# Patient Record
Sex: Female | Born: 1980 | Race: White | Hispanic: No | Marital: Married | Smoking: Never smoker
Health system: Southern US, Community
[De-identification: ages and names within clinical notes are randomized; demographics above are authoritative.]

## PROBLEM LIST (undated history)

## (undated) DIAGNOSIS — R87629 Unspecified abnormal cytological findings in specimens from vagina: Secondary | ICD-10-CM

## (undated) DIAGNOSIS — Z8619 Personal history of other infectious and parasitic diseases: Secondary | ICD-10-CM

## (undated) DIAGNOSIS — K439 Ventral hernia without obstruction or gangrene: Secondary | ICD-10-CM

## (undated) DIAGNOSIS — Z87820 Personal history of traumatic brain injury: Secondary | ICD-10-CM

## (undated) DIAGNOSIS — Z87828 Personal history of other (healed) physical injury and trauma: Secondary | ICD-10-CM

## (undated) HISTORY — DX: Unspecified abnormal cytological findings in specimens from vagina: R87.629

## (undated) HISTORY — DX: Personal history of other infectious and parasitic diseases: Z86.19

## (undated) HISTORY — DX: Personal history of traumatic brain injury: Z87.820

## (undated) HISTORY — PX: FOOT SURGERY: SHX648

## (undated) HISTORY — PX: LEEP: SHX91

## (undated) HISTORY — DX: Personal history of other (healed) physical injury and trauma: Z87.828

## (undated) HISTORY — PX: OTHER SURGICAL HISTORY: SHX169

---

## 2004-04-07 ENCOUNTER — Emergency Department (HOSPITAL_COMMUNITY): Admission: EM | Admit: 2004-04-07 | Discharge: 2004-04-07 | Payer: Self-pay | Admitting: *Deleted

## 2004-04-23 ENCOUNTER — Encounter: Admission: RE | Admit: 2004-04-23 | Discharge: 2004-04-23 | Payer: Self-pay | Admitting: Obstetrics and Gynecology

## 2004-08-04 ENCOUNTER — Emergency Department (HOSPITAL_COMMUNITY): Admission: EM | Admit: 2004-08-04 | Discharge: 2004-08-04 | Payer: Self-pay | Admitting: *Deleted

## 2004-08-10 ENCOUNTER — Emergency Department (HOSPITAL_COMMUNITY): Admission: EM | Admit: 2004-08-10 | Discharge: 2004-08-10 | Payer: Self-pay | Admitting: Emergency Medicine

## 2005-09-24 ENCOUNTER — Other Ambulatory Visit: Admission: RE | Admit: 2005-09-24 | Discharge: 2005-09-24 | Payer: Self-pay | Admitting: Obstetrics and Gynecology

## 2015-05-07 LAB — OB RESULTS CONSOLE ANTIBODY SCREEN: ANTIBODY SCREEN: NEGATIVE

## 2015-05-07 LAB — OB RESULTS CONSOLE ABO/RH: RH Type: POSITIVE

## 2015-05-07 LAB — OB RESULTS CONSOLE GC/CHLAMYDIA
CHLAMYDIA, DNA PROBE: NEGATIVE
GC PROBE AMP, GENITAL: NEGATIVE

## 2015-05-07 LAB — OB RESULTS CONSOLE HEPATITIS B SURFACE ANTIGEN: HEP B S AG: NEGATIVE

## 2015-05-07 LAB — OB RESULTS CONSOLE RUBELLA ANTIBODY, IGM: RUBELLA: IMMUNE

## 2015-05-07 LAB — OB RESULTS CONSOLE HIV ANTIBODY (ROUTINE TESTING): HIV: NONREACTIVE

## 2015-05-07 LAB — OB RESULTS CONSOLE RPR: RPR: NONREACTIVE

## 2015-12-12 ENCOUNTER — Encounter (HOSPITAL_COMMUNITY): Payer: Self-pay | Admitting: *Deleted

## 2015-12-12 ENCOUNTER — Telehealth (HOSPITAL_COMMUNITY): Payer: Self-pay | Admitting: *Deleted

## 2015-12-12 LAB — OB RESULTS CONSOLE GBS: GBS: POSITIVE

## 2015-12-12 NOTE — Telephone Encounter (Signed)
Preadmission screen  

## 2015-12-13 ENCOUNTER — Inpatient Hospital Stay (HOSPITAL_COMMUNITY): Admission: AD | Admit: 2015-12-13 | Payer: Self-pay | Source: Ambulatory Visit | Admitting: Obstetrics and Gynecology

## 2015-12-17 ENCOUNTER — Inpatient Hospital Stay (HOSPITAL_COMMUNITY)
Admission: RE | Admit: 2015-12-17 | Discharge: 2015-12-20 | DRG: 766 | Disposition: A | Discharge status: Home / Self Care | Payer: PRIVATE HEALTH INSURANCE | Source: Ambulatory Visit | Attending: Obstetrics and Gynecology | Admitting: Obstetrics and Gynecology

## 2015-12-17 ENCOUNTER — Inpatient Hospital Stay (HOSPITAL_COMMUNITY): Payer: PRIVATE HEALTH INSURANCE | Admitting: Anesthesiology

## 2015-12-17 ENCOUNTER — Encounter (HOSPITAL_COMMUNITY)
Admission: RE | Disposition: A | Discharge status: Home / Self Care | Payer: Self-pay | Source: Ambulatory Visit | Attending: Obstetrics and Gynecology

## 2015-12-17 ENCOUNTER — Encounter (HOSPITAL_COMMUNITY): Payer: Self-pay

## 2015-12-17 VITALS — BP 115/66 | HR 62 | Temp 97.6°F | Resp 20 | Ht 67.0 in | Wt 193.0 lb

## 2015-12-17 DIAGNOSIS — Z3A40 40 weeks gestation of pregnancy: Secondary | ICD-10-CM

## 2015-12-17 DIAGNOSIS — O99824 Streptococcus B carrier state complicating childbirth: Secondary | ICD-10-CM | POA: Diagnosis present

## 2015-12-17 DIAGNOSIS — O480 Post-term pregnancy: Secondary | ICD-10-CM | POA: Diagnosis present

## 2015-12-17 DIAGNOSIS — Z98891 History of uterine scar from previous surgery: Secondary | ICD-10-CM

## 2015-12-17 LAB — CBC
HEMATOCRIT: 36.3 % (ref 36.0–46.0)
Hemoglobin: 12.9 g/dL (ref 12.0–15.0)
MCH: 32.5 pg (ref 26.0–34.0)
MCHC: 35.5 g/dL (ref 30.0–36.0)
MCV: 91.4 fL (ref 78.0–100.0)
PLATELETS: 222 10*3/uL (ref 150–400)
RBC: 3.97 MIL/uL (ref 3.87–5.11)
RDW: 13.2 % (ref 11.5–15.5)
WBC: 10.8 10*3/uL — AB (ref 4.0–10.5)

## 2015-12-17 LAB — ABO/RH: ABO/RH(D): O POS

## 2015-12-17 LAB — TYPE AND SCREEN
ABO/RH(D): O POS
ANTIBODY SCREEN: NEGATIVE

## 2015-12-17 SURGERY — Surgical Case
Anesthesia: General

## 2015-12-17 MED ORDER — SODIUM BICARBONATE 8.4 % IV SOLN
INTRAVENOUS | Status: AC
  Filled 2015-12-17: qty 50

## 2015-12-17 MED ORDER — LIDOCAINE-EPINEPHRINE (PF) 2 %-1:200000 IJ SOLN
INTRAMUSCULAR | Status: AC
  Filled 2015-12-17: qty 20

## 2015-12-17 MED ORDER — OXYTOCIN 10 UNIT/ML IJ SOLN
1.0000 m[IU]/min | INTRAVENOUS | Status: DC
  Administered 2015-12-17: 2 m[IU]/min via INTRAVENOUS
  Filled 2015-12-17: qty 4

## 2015-12-17 MED ORDER — LIDOCAINE HCL (PF) 1 % IJ SOLN: 30.0000 mL | INTRAMUSCULAR | Status: DC | PRN

## 2015-12-17 MED ORDER — ONDANSETRON HCL 4 MG/2ML IJ SOLN
INTRAMUSCULAR | Status: DC | PRN
  Administered 2015-12-17: 4 mg via INTRAVENOUS

## 2015-12-17 MED ORDER — DEXAMETHASONE SODIUM PHOSPHATE 10 MG/ML IJ SOLN
INTRAMUSCULAR | Status: AC
  Filled 2015-12-17: qty 1

## 2015-12-17 MED ORDER — SCOPOLAMINE 1 MG/3DAYS TD PT72
MEDICATED_PATCH | TRANSDERMAL | Status: DC | PRN
  Administered 2015-12-17: 1 via TRANSDERMAL

## 2015-12-17 MED ORDER — TERBUTALINE SULFATE 1 MG/ML IJ SOLN: 0.2500 mg | Freq: Once | INTRAMUSCULAR | Status: DC | PRN

## 2015-12-17 MED ORDER — CEFAZOLIN SODIUM 1-5 GM-% IV SOLN: INTRAVENOUS | Status: DC | PRN

## 2015-12-17 MED ORDER — LACTATED RINGERS IV SOLN
500.0000 mL | Freq: Once | INTRAVENOUS | Status: AC
  Administered 2015-12-17: 500 mL via INTRAVENOUS

## 2015-12-17 MED ORDER — OXYTOCIN BOLUS FROM INFUSION: 500.0000 mL | INTRAVENOUS | Status: DC

## 2015-12-17 MED ORDER — MORPHINE SULFATE (PF) 0.5 MG/ML IJ SOLN
INTRAMUSCULAR | Status: DC | PRN
  Administered 2015-12-17: 1 mg via INTRAVENOUS
  Administered 2015-12-17: 4 mg via EPIDURAL

## 2015-12-17 MED ORDER — DEXAMETHASONE SODIUM PHOSPHATE 10 MG/ML IJ SOLN
INTRAMUSCULAR | Status: DC | PRN
  Administered 2015-12-17: 10 mg via INTRAVENOUS

## 2015-12-17 MED ORDER — CITRIC ACID-SODIUM CITRATE 334-500 MG/5ML PO SOLN
30.0000 mL | ORAL | Status: DC | PRN
  Administered 2015-12-17: 30 mL via ORAL
  Filled 2015-12-17: qty 15

## 2015-12-17 MED ORDER — SODIUM BICARBONATE 8.4 % IV SOLN
INTRAVENOUS | Status: DC | PRN
  Administered 2015-12-17 (×3): 5 mL via EPIDURAL

## 2015-12-17 MED ORDER — PENICILLIN G POTASSIUM 5000000 UNITS IJ SOLR
5.0000 10*6.[IU] | Freq: Once | INTRAVENOUS | Status: AC
  Administered 2015-12-17: 5 10*6.[IU] via INTRAVENOUS
  Filled 2015-12-17: qty 5

## 2015-12-17 MED ORDER — CEFAZOLIN SODIUM-DEXTROSE 2-3 GM-% IV SOLR
INTRAVENOUS | Status: DC | PRN
  Administered 2015-12-17: 2 g via INTRAVENOUS

## 2015-12-17 MED ORDER — LACTATED RINGERS IV SOLN
500.0000 mL | INTRAVENOUS | Status: DC | PRN
  Administered 2015-12-17: 500 mL via INTRAVENOUS

## 2015-12-17 MED ORDER — LIDOCAINE HCL (PF) 1 % IJ SOLN
INTRAMUSCULAR | Status: DC | PRN
  Administered 2015-12-17 (×2): 4 mL

## 2015-12-17 MED ORDER — CEFAZOLIN SODIUM-DEXTROSE 2-4 GM/100ML-% IV SOLN
INTRAVENOUS | Status: AC
  Filled 2015-12-17: qty 100

## 2015-12-17 MED ORDER — PENICILLIN G POTASSIUM 5000000 UNITS IJ SOLR
2.5000 10*6.[IU] | INTRAVENOUS | Status: DC
  Administered 2015-12-17 (×2): 2.5 10*6.[IU] via INTRAVENOUS
  Filled 2015-12-17 (×6): qty 2.5

## 2015-12-17 MED ORDER — SCOPOLAMINE 1 MG/3DAYS TD PT72
MEDICATED_PATCH | TRANSDERMAL | Status: AC
  Filled 2015-12-17: qty 1

## 2015-12-17 MED ORDER — PHENYLEPHRINE 40 MCG/ML (10ML) SYRINGE FOR IV PUSH (FOR BLOOD PRESSURE SUPPORT)
80.0000 ug | PREFILLED_SYRINGE | INTRAVENOUS | Status: DC | PRN
  Administered 2015-12-17: 80 ug via INTRAVENOUS
  Filled 2015-12-17: qty 20

## 2015-12-17 MED ORDER — OXYTOCIN 10 UNIT/ML IJ SOLN
INTRAMUSCULAR | Status: AC
  Filled 2015-12-17: qty 4

## 2015-12-17 MED ORDER — PHENYLEPHRINE 40 MCG/ML (10ML) SYRINGE FOR IV PUSH (FOR BLOOD PRESSURE SUPPORT): 80.0000 ug | PREFILLED_SYRINGE | INTRAVENOUS | Status: DC | PRN

## 2015-12-17 MED ORDER — OXYCODONE-ACETAMINOPHEN 5-325 MG PO TABS: 1.0000 | ORAL_TABLET | ORAL | Status: DC | PRN

## 2015-12-17 MED ORDER — MORPHINE SULFATE (PF) 0.5 MG/ML IJ SOLN
INTRAMUSCULAR | Status: AC
  Filled 2015-12-17: qty 10

## 2015-12-17 MED ORDER — ONDANSETRON HCL 4 MG/2ML IJ SOLN
4.0000 mg | Freq: Four times a day (QID) | INTRAMUSCULAR | Status: DC | PRN
  Administered 2015-12-17: 4 mg via INTRAVENOUS
  Filled 2015-12-17: qty 2

## 2015-12-17 MED ORDER — LACTATED RINGERS IV SOLN
INTRAVENOUS | Status: DC | PRN
  Administered 2015-12-17 (×2): via INTRAVENOUS

## 2015-12-17 MED ORDER — ONDANSETRON HCL 4 MG/2ML IJ SOLN
INTRAMUSCULAR | Status: AC
  Filled 2015-12-17: qty 2

## 2015-12-17 MED ORDER — PHENYLEPHRINE 40 MCG/ML (10ML) SYRINGE FOR IV PUSH (FOR BLOOD PRESSURE SUPPORT)
PREFILLED_SYRINGE | INTRAVENOUS | Status: AC
  Filled 2015-12-17: qty 10

## 2015-12-17 MED ORDER — FLEET ENEMA 7-19 GM/118ML RE ENEM: 1.0000 | ENEMA | RECTAL | Status: DC | PRN

## 2015-12-17 MED ORDER — LACTATED RINGERS IV SOLN
INTRAVENOUS | Status: DC | PRN
  Administered 2015-12-17: 23:00:00 via INTRAVENOUS

## 2015-12-17 MED ORDER — PHENYLEPHRINE HCL 10 MG/ML IJ SOLN
INTRAMUSCULAR | Status: DC | PRN
  Administered 2015-12-17 (×2): 80 ug via INTRAVENOUS
  Administered 2015-12-17: 120 ug via INTRAVENOUS

## 2015-12-17 MED ORDER — OXYCODONE-ACETAMINOPHEN 5-325 MG PO TABS: 2.0000 | ORAL_TABLET | ORAL | Status: DC | PRN

## 2015-12-17 MED ORDER — OXYTOCIN 10 UNIT/ML IJ SOLN: 2.5000 [IU]/h | INTRAVENOUS | Status: DC

## 2015-12-17 MED ORDER — EPHEDRINE 5 MG/ML INJ: 10.0000 mg | INTRAVENOUS | Status: DC | PRN

## 2015-12-17 MED ORDER — ACETAMINOPHEN 325 MG PO TABS: 650.0000 mg | ORAL_TABLET | ORAL | Status: DC | PRN

## 2015-12-17 MED ORDER — MISOPROSTOL 25 MCG QUARTER TABLET
25.0000 ug | ORAL_TABLET | ORAL | Status: DC | PRN
  Administered 2015-12-17: 25 ug via VAGINAL
  Filled 2015-12-17: qty 0.25

## 2015-12-17 MED ORDER — LACTATED RINGERS IV SOLN
INTRAVENOUS | Status: DC
  Administered 2015-12-17 (×3): via INTRAVENOUS

## 2015-12-17 MED ORDER — METOCLOPRAMIDE HCL 5 MG/ML IJ SOLN
INTRAMUSCULAR | Status: AC
  Filled 2015-12-17: qty 2

## 2015-12-17 MED ORDER — DIPHENHYDRAMINE HCL 50 MG/ML IJ SOLN: 12.5000 mg | INTRAMUSCULAR | Status: DC | PRN

## 2015-12-17 MED ORDER — SODIUM CHLORIDE 0.9 % IR SOLN
Status: DC | PRN
  Administered 2015-12-17: 1000 mL

## 2015-12-17 MED ORDER — METOCLOPRAMIDE HCL 5 MG/ML IJ SOLN
INTRAMUSCULAR | Status: DC | PRN
  Administered 2015-12-17: 10 mg via INTRAVENOUS

## 2015-12-17 MED ORDER — FENTANYL 2.5 MCG/ML BUPIVACAINE 1/10 % EPIDURAL INFUSION (WH - ANES)
14.0000 mL/h | INTRAMUSCULAR | Status: DC | PRN
  Administered 2015-12-17: 14 mL/h via EPIDURAL
  Filled 2015-12-17: qty 125

## 2015-12-17 MED ORDER — OXYTOCIN 10 UNIT/ML IJ SOLN
40.0000 [IU] | INTRAVENOUS | Status: DC | PRN
  Administered 2015-12-17: 40 [IU] via INTRAVENOUS

## 2015-12-17 SURGICAL SUPPLY — 34 items
BENZOIN TINCTURE PRP APPL 2/3 (GAUZE/BANDAGES/DRESSINGS) ×3 IMPLANT
CHLORAPREP W/TINT 26ML (MISCELLANEOUS) ×3 IMPLANT
CLAMP CORD UMBIL (MISCELLANEOUS) IMPLANT
CLOSURE STERI STRIP 1/2 X4 (GAUZE/BANDAGES/DRESSINGS) ×3 IMPLANT
CLOSURE WOUND 1/2 X4 (GAUZE/BANDAGES/DRESSINGS)
CLOTH BEACON ORANGE TIMEOUT ST (SAFETY) ×3 IMPLANT
CONTAINER PREFILL 10% NBF 15ML (MISCELLANEOUS) IMPLANT
DRSG OPSITE POSTOP 4X10 (GAUZE/BANDAGES/DRESSINGS) ×3 IMPLANT
ELECT REM PT RETURN 9FT ADLT (ELECTROSURGICAL) ×3
ELECTRODE REM PT RTRN 9FT ADLT (ELECTROSURGICAL) ×1 IMPLANT
EXTRACTOR VACUUM M CUP 4 TUBE (SUCTIONS) IMPLANT
EXTRACTOR VACUUM M CUP 4' TUBE (SUCTIONS)
GLOVE BIO SURGEON STRL SZ8 (GLOVE) ×3 IMPLANT
GLOVE BIOGEL PI IND STRL 7.0 (GLOVE) ×2 IMPLANT
GLOVE BIOGEL PI INDICATOR 7.0 (GLOVE) ×4
GOWN STRL REUS W/TWL LRG LVL3 (GOWN DISPOSABLE) ×9 IMPLANT
KIT ABG SYR 3ML LUER SLIP (SYRINGE) ×3 IMPLANT
LIQUID BAND (GAUZE/BANDAGES/DRESSINGS) IMPLANT
NEEDLE HYPO 25X5/8 SAFETYGLIDE (NEEDLE) ×3 IMPLANT
NS IRRIG 1000ML POUR BTL (IV SOLUTION) ×3 IMPLANT
PACK C SECTION WH (CUSTOM PROCEDURE TRAY) ×3 IMPLANT
PAD OB MATERNITY 4.3X12.25 (PERSONAL CARE ITEMS) ×3 IMPLANT
PENCIL SMOKE EVAC W/HOLSTER (ELECTROSURGICAL) ×3 IMPLANT
STRIP CLOSURE SKIN 1/2X4 (GAUZE/BANDAGES/DRESSINGS) IMPLANT
SUT MNCRL 0 VIOLET CTX 36 (SUTURE) ×4 IMPLANT
SUT MONOCRYL 0 CTX 36 (SUTURE) ×8
SUT PDS AB 0 CTX 60 (SUTURE) ×3 IMPLANT
SUT PLAIN 0 NONE (SUTURE) IMPLANT
SUT PLAIN 2 0 (SUTURE) ×2
SUT PLAIN 2 0 XLH (SUTURE) IMPLANT
SUT PLAIN ABS 2-0 CT1 27XMFL (SUTURE) ×1 IMPLANT
SUT VIC AB 4-0 KS 27 (SUTURE) ×3 IMPLANT
TOWEL OR 17X24 6PK STRL BLUE (TOWEL DISPOSABLE) ×3 IMPLANT
TRAY FOLEY CATH SILVER 14FR (SET/KITS/TRAYS/PACK) ×3 IMPLANT

## 2015-12-17 NOTE — Anesthesia Preprocedure Evaluation (Addendum)
Anesthesia Evaluation  Patient identified by MRN, date of birth, ID band Patient awake    Reviewed: Allergy & Precautions, NPO status , Patient's Chart, lab work & pertinent test results  History of Anesthesia Complications Negative for: history of anesthetic complications  Airway Mallampati: II  TM Distance: >3 FB Neck ROM: Full    Dental no notable dental hx. (+) Dental Advisory Given   Pulmonary neg pulmonary ROS,    Pulmonary exam normal breath sounds clear to auscultation       Cardiovascular negative cardio ROS Normal cardiovascular exam Rhythm:Regular Rate:Normal     Neuro/Psych negative neurological ROS  negative psych ROS   GI/Hepatic negative GI ROS, Neg liver ROS,   Endo/Other  negative endocrine ROSobesity  Renal/GU negative Renal ROS  negative genitourinary   Musculoskeletal negative musculoskeletal ROS (+)   Abdominal   Peds negative pediatric ROS (+)  Hematology negative hematology ROS (+)   Anesthesia Other Findings   Reproductive/Obstetrics (+) Pregnancy                            Anesthesia Physical Anesthesia Plan  ASA: II  Anesthesia Plan: General   Post-op Pain Management:    Induction: Intravenous  Airway Management Planned:   Additional Equipment:   Intra-op Plan:   Post-operative Plan: Extubation in OR  Informed Consent: I have reviewed the patients History and Physical, chart, labs and discussed the procedure including the risks, benefits and alternatives for the proposed anesthesia with the patient or authorized representative who has indicated his/her understanding and acceptance.   Dental advisory given  Plan Discussed with: CRNA  Anesthesia Plan Comments:         Anesthesia Quick Evaluation

## 2015-12-17 NOTE — Progress Notes (Signed)
Pitocin on FHT cat one UCs q2-3 min Cx  1/50/-3/vtx

## 2015-12-17 NOTE — H&P (Signed)
Mary Frye is a 35 y.o. female presenting for 2 stage IOL. Admitted last PM. S/P cytotec. No ROM, no bleeding. Maternal Medical History:  Fetal activity: Perceived fetal activity is normal.      OB History    Gravida Para Term Preterm AB TAB SAB Ectopic Multiple Living   2    1  1         Past Medical History  Diagnosis Date  . Vaginal Pap smear, abnormal   . Hx of varicella   . History of back injury     horse injury trt with physical therapy  . History of concussion     years ago horse injury   Past Surgical History  Procedure Laterality Date  . Leep    . Dental implants      35 yo   Family History: family history includes Cancer in her maternal aunt and maternal aunt; Seizures in her maternal aunt. Social History:  reports that she has never smoked. She does not have any smokeless tobacco history on file. Her alcohol and drug histories are not on file.   Prenatal Transfer Tool  Maternal Diabetes: No Genetic Screening: Normal Maternal Ultrasounds/Referrals: Normal Fetal Ultrasounds or other Referrals:  None Maternal Substance Abuse:  No Significant Maternal Medications:  None Significant Maternal Lab Results:  None Other Comments:  None  Review of Systems  Eyes: Negative for blurred vision.  Gastrointestinal: Negative for abdominal pain.  Neurological: Negative for headaches.    Dilation: 1 Effacement (%): 30 Station: -2 Exam by:: Dr Henderson Cloudomblin Blood pressure 123/79, pulse 75, temperature 97.5 F (36.4 C), temperature source Oral, resp. rate 16, height 5\' 7"  (1.702 m), weight 193 lb (87.544 kg), last menstrual period 03/08/2015. Maternal Exam:  Uterine Assessment: Contraction strength is mild.  Contraction frequency is irregular.   Abdomen: Patient reports no abdominal tenderness.   Fetal Exam Fetal State Assessment: Category I - tracings are normal.     Physical Exam  Cardiovascular: Normal rate and regular rhythm.   Respiratory: Effort normal and  breath sounds normal.  GI: Soft. There is no tenderness.  Neurological: She has normal reflexes.    Cx 1/30/high/soft Bedside U/S  =  vtx  Prenatal labs: ABO, Rh: --/--/O POS, O POS (04/24 0055) Antibody: NEG (04/24 0055) Rubella: Immune (09/12 0000) RPR: Nonreactive (09/12 0000)  HBsAg: Negative (09/12 0000)  HIV: Non-reactive (09/12 0000)  GBS: Positive (04/19 0000)   Assessment/Plan: 35 yo G2P0 @ 40 4/7 weeks D/W patient and husband process and risks of two stage induction of labor Begin Pitocin   Mary Frye,Mary Frye 12/17/2015, 8:23 AM

## 2015-12-17 NOTE — Brief Op Note (Signed)
12/17/2015  11:49 PM  PATIENT:  Mary Frye  35 y.o. female  PRE-OPERATIVE DIAGNOSIS:  Arrest of dilation arrest of dialation  POST-OPERATIVE DIAGNOSIS:  Arrest of dilation arrest of dialation  PROCEDURE:  Procedure(s): CESAREAN SECTION (N/A)  SURGEON:  Surgeon(s) and Role:    * Harold HedgeJames Aubree Doody, MD - Primary  PHYSICIAN ASSISTANT:   ASSISTANTS: none   ANESTHESIA:   epidural  EBL:  Total I/O In: 1000 [I.V.:1000] Out: 1300 [Urine:800; Blood:500]  BLOOD ADMINISTERED:none  DRAINS: Urinary Catheter (Foley)   LOCAL MEDICATIONS USED:  NONE  SPECIMEN:  No Specimen  DISPOSITION OF SPECIMEN:  N/A  COUNTS:  YES  TOURNIQUET:  * No tourniquets in log *  DICTATION: .Other Dictation: Dictation Number [id number]  PLAN OF CARE: Admit to inpatient   PATIENT DISPOSITION:  PACU - hemodynamically stable.   Delay start of Pharmacological VTE agent (>24hrs) due to surgical blood loss or risk of bleeding: not applicable

## 2015-12-17 NOTE — Anesthesia Procedure Notes (Signed)
Epidural Patient location during procedure: OB  Staffing Anesthesiologist: Yakov Bergen Performed by: anesthesiologist   Preanesthetic Checklist Completed: patient identified, site marked, surgical consent, pre-op evaluation, timeout performed, IV checked, risks and benefits discussed and monitors and equipment checked  Epidural Patient position: sitting Prep: site prepped and draped and DuraPrep Patient monitoring: continuous pulse ox and blood pressure Approach: midline Location: L3-L4 Injection technique: LOR saline  Needle:  Needle type: Tuohy  Needle gauge: 17 G Needle length: 9 cm and 9 Needle insertion depth: 6 cm Catheter type: closed end flexible Catheter size: 19 Gauge Catheter at skin depth: 10 cm Test dose: negative  Assessment Events: blood not aspirated, injection not painful, no injection resistance, negative IV test and no paresthesia  Additional Notes Patient identified. Risks/Benefits/Options discussed with patient including but not limited to bleeding, infection, nerve damage, paralysis, failed block, incomplete pain control, headache, blood pressure changes, nausea, vomiting, reactions to medication both or allergic, itching and postpartum back pain. Confirmed with bedside nurse the patient's most recent platelet count. Confirmed with patient that they are not currently taking any anticoagulation, have any bleeding history or any family history of bleeding disorders. Patient expressed understanding and wished to proceed. All questions were answered. Sterile technique was used throughout the entire procedure. Please see nursing notes for vital signs. Test dose was given through epidural catheter and negative prior to continuing to dose epidural or start infusion. Warning signs of high block given to the patient including shortness of breath, tingling/numbness in hands, complete motor block, or any concerning symptoms with instructions to call for help. Patient was  given instructions on fall risk and not to get out of bed. All questions and concerns addressed with instructions to call with any issues or inadequate analgesia.    

## 2015-12-17 NOTE — Progress Notes (Signed)
Adequate labor > 2 hours Cervix no change FHT cat one A/P: arrest of dilation. D/W patient options-will proceed with cesarean section. D/W risks including infection, organ damage, bleeding/transfusion-HIV/Hep, DVT/PE, pneumonia. Patient states she understands and agrees.

## 2015-12-17 NOTE — Progress Notes (Signed)
Pitocin turned off about 2.5 hours ago because of frequent contractions Epidural in SROM - clear Cx 3/70/-3 IUPC placed UCs irreg about q2-4 min Will resume pitocin D/W patient and husband

## 2015-12-18 ENCOUNTER — Encounter (HOSPITAL_COMMUNITY): Payer: Self-pay | Admitting: Obstetrics and Gynecology

## 2015-12-18 DIAGNOSIS — Z98891 History of uterine scar from previous surgery: Secondary | ICD-10-CM

## 2015-12-18 LAB — CBC
HEMATOCRIT: 35.4 % — AB (ref 36.0–46.0)
Hemoglobin: 12.6 g/dL (ref 12.0–15.0)
MCH: 32.4 pg (ref 26.0–34.0)
MCHC: 35.6 g/dL (ref 30.0–36.0)
MCV: 91 fL (ref 78.0–100.0)
Platelets: 209 10*3/uL (ref 150–400)
RBC: 3.89 MIL/uL (ref 3.87–5.11)
RDW: 13.2 % (ref 11.5–15.5)
WBC: 20.8 10*3/uL — AB (ref 4.0–10.5)

## 2015-12-18 LAB — RPR: RPR: NONREACTIVE

## 2015-12-18 MED ORDER — KETOROLAC TROMETHAMINE 30 MG/ML IJ SOLN
INTRAMUSCULAR | Status: AC
  Filled 2015-12-18: qty 1

## 2015-12-18 MED ORDER — ONDANSETRON HCL 4 MG/2ML IJ SOLN: 4.0000 mg | Freq: Three times a day (TID) | INTRAMUSCULAR | Status: DC | PRN

## 2015-12-18 MED ORDER — SCOPOLAMINE 1 MG/3DAYS TD PT72
1.0000 | MEDICATED_PATCH | Freq: Once | TRANSDERMAL | Status: DC
Start: 2015-12-18 — End: 2015-12-20
  Filled 2015-12-18: qty 1

## 2015-12-18 MED ORDER — LACTATED RINGERS IV SOLN: INTRAVENOUS | Status: DC

## 2015-12-18 MED ORDER — ZOLPIDEM TARTRATE 5 MG PO TABS: 5.0000 mg | ORAL_TABLET | Freq: Every evening | ORAL | Status: DC | PRN

## 2015-12-18 MED ORDER — NALBUPHINE HCL 10 MG/ML IJ SOLN: 5.0000 mg | Freq: Once | INTRAMUSCULAR | Status: DC | PRN

## 2015-12-18 MED ORDER — OXYCODONE-ACETAMINOPHEN 5-325 MG PO TABS
1.0000 | ORAL_TABLET | ORAL | Status: DC | PRN
  Administered 2015-12-18 – 2015-12-19 (×6): 1 via ORAL
  Filled 2015-12-18 (×6): qty 1

## 2015-12-18 MED ORDER — WITCH HAZEL-GLYCERIN EX PADS
1.0000 | MEDICATED_PAD | CUTANEOUS | Status: DC | PRN
Start: 2015-12-18 — End: 2015-12-20

## 2015-12-18 MED ORDER — DIBUCAINE 1 % RE OINT: 1.0000 "application " | TOPICAL_OINTMENT | RECTAL | Status: DC | PRN

## 2015-12-18 MED ORDER — OXYCODONE-ACETAMINOPHEN 5-325 MG PO TABS
2.0000 | ORAL_TABLET | ORAL | Status: DC | PRN
  Administered 2015-12-19 – 2015-12-20 (×4): 2 via ORAL
  Filled 2015-12-18 (×4): qty 2

## 2015-12-18 MED ORDER — NALBUPHINE HCL 10 MG/ML IJ SOLN
5.0000 mg | INTRAMUSCULAR | Status: DC | PRN
Start: 2015-12-18 — End: 2015-12-20

## 2015-12-18 MED ORDER — NALOXONE HCL 2 MG/2ML IJ SOSY
1.0000 ug/kg/h | PREFILLED_SYRINGE | INTRAVENOUS | Status: DC | PRN
  Filled 2015-12-18: qty 2

## 2015-12-18 MED ORDER — NALBUPHINE HCL 10 MG/ML IJ SOLN: 5.0000 mg | INTRAMUSCULAR | Status: DC | PRN

## 2015-12-18 MED ORDER — DIPHENHYDRAMINE HCL 25 MG PO CAPS: 25.0000 mg | ORAL_CAPSULE | Freq: Four times a day (QID) | ORAL | Status: DC | PRN

## 2015-12-18 MED ORDER — SIMETHICONE 80 MG PO CHEW: 80.0000 mg | CHEWABLE_TABLET | ORAL | Status: DC | PRN

## 2015-12-18 MED ORDER — SENNOSIDES-DOCUSATE SODIUM 8.6-50 MG PO TABS
2.0000 | ORAL_TABLET | ORAL | Status: DC
  Administered 2015-12-18 – 2015-12-19 (×2): 2 via ORAL
  Filled 2015-12-18 (×2): qty 2

## 2015-12-18 MED ORDER — MENTHOL 3 MG MT LOZG: 1.0000 | LOZENGE | OROMUCOSAL | Status: DC | PRN

## 2015-12-18 MED ORDER — KETOROLAC TROMETHAMINE 30 MG/ML IJ SOLN
30.0000 mg | Freq: Once | INTRAMUSCULAR | Status: AC
  Administered 2015-12-18: 30 mg via INTRAVENOUS

## 2015-12-18 MED ORDER — ACETAMINOPHEN 325 MG PO TABS
650.0000 mg | ORAL_TABLET | ORAL | Status: DC | PRN
  Administered 2015-12-18: 650 mg via ORAL
  Filled 2015-12-18: qty 2

## 2015-12-18 MED ORDER — IBUPROFEN 600 MG PO TABS
600.0000 mg | ORAL_TABLET | Freq: Four times a day (QID) | ORAL | Status: DC
  Administered 2015-12-18 – 2015-12-20 (×9): 600 mg via ORAL
  Filled 2015-12-18 (×9): qty 1

## 2015-12-18 MED ORDER — NALOXONE HCL 0.4 MG/ML IJ SOLN: 0.4000 mg | INTRAMUSCULAR | Status: DC | PRN

## 2015-12-18 MED ORDER — TETANUS-DIPHTH-ACELL PERTUSSIS 5-2.5-18.5 LF-MCG/0.5 IM SUSP: 0.5000 mL | Freq: Once | INTRAMUSCULAR | Status: DC

## 2015-12-18 MED ORDER — COCONUT OIL OIL: 1.0000 "application " | TOPICAL_OIL | Status: DC | PRN

## 2015-12-18 MED ORDER — PRENATAL MULTIVITAMIN CH
1.0000 | ORAL_TABLET | Freq: Every day | ORAL | Status: DC
  Administered 2015-12-18 – 2015-12-20 (×2): 1 via ORAL
  Filled 2015-12-18 (×3): qty 1

## 2015-12-18 MED ORDER — SIMETHICONE 80 MG PO CHEW
80.0000 mg | CHEWABLE_TABLET | ORAL | Status: DC
  Administered 2015-12-18 – 2015-12-19 (×2): 80 mg via ORAL
  Filled 2015-12-18 (×2): qty 1

## 2015-12-18 MED ORDER — SIMETHICONE 80 MG PO CHEW
80.0000 mg | CHEWABLE_TABLET | Freq: Three times a day (TID) | ORAL | Status: DC
  Administered 2015-12-18 – 2015-12-20 (×6): 80 mg via ORAL
  Filled 2015-12-18 (×6): qty 1

## 2015-12-18 MED ORDER — MEPERIDINE HCL 25 MG/ML IJ SOLN: 6.2500 mg | INTRAMUSCULAR | Status: DC | PRN

## 2015-12-18 MED ORDER — SODIUM CHLORIDE 0.9% FLUSH: 3.0000 mL | INTRAVENOUS | Status: DC | PRN

## 2015-12-18 MED ORDER — OXYTOCIN 10 UNIT/ML IJ SOLN: 2.5000 [IU]/h | INTRAMUSCULAR | Status: AC

## 2015-12-18 NOTE — Op Note (Signed)
[name], Mary Frye NO.:  [id number]  MEDICAL RECORD NO.:  [id number]  LOCATION:  [facility]                          FACILITY:  [facility]  PHYSICIAN:  Guy Sandifer. Henderson Cloud, M.D. DATE OF BIRTH:  Mar 20, 1981  DATE OF PROCEDURE:  12/17/2015 DATE OF DISCHARGE:                              OPERATIVE REPORT   PREOPERATIVE DIAGNOSIS:  Arrest of cervical dilation.  POSTOPERATIVE DIAGNOSIS:  Arrest of cervical dilation.  PROCEDURE:  Primary low transverse cesarean section.  SURGEON:  Guy Sandifer. Henderson Cloud, MD  ANESTHESIA:  Epidural, E. Jairo Ben, MD  ESTIMATED BLOOD LOSS:  500 mL.  SPECIMENS:  Cord blood collected for private storage per patient request.  FINDINGS:  Viable female infant, Apgars, arterial cord pH, birth weight pending.  INDICATIONS AND CONSENT:  This patient is a 35 year old patient at 17- 4/7 weeks who was admitted for 2-stage induction of labor.  She progresses to 3 cm dilation, 70% effaced, -3 station.  With adequate labor dye documented by intrauterine pressure catheter, the baby failed to descend and there is arrest of dilation.  Options were discussed and patient requests cesarean section.  Potential risks and complications were reviewed preoperatively including, but not limited to, infection, organ damage, bleeding requiring transfusion of blood products with HIV and hepatitis acquisition, DVT, PE, pneumonia.  All questions answered and consent is signed on the chart.  The patient states she understands and agrees.  PROCEDURE IN DETAIL:  The patient was taken to the operating room, where she was identified and the epidural anesthetic was augmented to a surgical level.  She was placed in dorsal supine position with a 15- degree left lateral wedge.  Foley catheter was already in place.  She was prepped and draped in a sterile fashion per [facility] protocol.  Time-out was undertaken.  After testing for adequate epidural anesthesia, skin was  entered through a Pfannenstiel incision and dissection was carried out in layers of the peritoneum.  Peritoneum was incised, extended superiorly and inferiorly.  Vesicouterine peritoneum was dissected cephalolaterally.  The bladder flap was developed.  The bladder blade was placed.  Uterus was incised in a low transverse manner and the uterine cavity was entered bluntly with a hemostat.  The uterine incision was extended cephalolaterally with the fingers.  Vertex then delivered without difficulty.  A loose nuchal cord x1 was noted.  Cord was clamped and cut and the baby was handed to awaiting pediatrics team. Placenta was manually delivered.  Uterine cavity is clean.  Uterus was closed in 2 running locking imbricating layers of 0 Monocryl suture. Third layer was placed to obtain complete hemostasis.  Lavage was carried out.  The anterior peritoneum was closed in a running fashion with a 0 Monocryl suture which was also used to reapproximate the pyramidalis muscle in the midline.  Anterior rectus fascia was closed in a running fashion with a 0 looped PDS.  Subcutaneous layer was closed with interrupted plain.  Skin was closed in a subcuticular fashion with 4-0 Vicryl on a Keith needle.  Steri-Strips and benzoin was applied. Sterile dressings were applied.  All counts were correct.  The patient was taken to recovery room in stable  condition.     Guy SandiferJames E. Henderson Cloudomblin, M.D.     JET/MEDQ  D:  12/17/2015  T:  12/18/2015  Job:  [id number]

## 2015-12-18 NOTE — Lactation Note (Signed)
This note was copied from a baby's chart. Lactation Consultation Note  Patient Name: Mary Noel GeroldMaridee Steidle WUJWJ'XToday's Date: 12/18/2015 Reason for consult: Initial assessment;Difficult latch Mom has had some difficulty with latch thus far. Baby biting, chewing at breast, tongue thrusting.  Baby has high palate and recessed chin. Mom's right nipple is erect but short shaft, left nipple flattens with breast compression, aerola edema bilateral. Demonstrated to Mom how to use hand pump to pre-pump to soften nipple/aerola. Could not get baby latched to left breast at this visit, but baby did latch to right nipple off/on using breast compression. Baby tucks his lower lip, he is having difficulty sustaining depth. Demonstrated suck training to parents. Encouraged FOB to do suck training while Mom pre-pumps before trying to latch baby. If baby will not latch, hand express and spoon feed some colostrum. Mom getting her own DEBP from home to start post pumping every 3 hours for 15 minutes to soften nipple/aerola to help baby latch better. Discussed with Mom she may need a nipple shield if latch does not improve with pumping. Encouraged Mom to call for assist with latch till baby latching consistently.  Lactation brochure left for review, advised of OP services and support group.  Maternal Data Has patient been taught Hand Expression?: Yes Does the patient have breastfeeding experience prior to this delivery?: No  Feeding Feeding Type: Breast Fed Length of feed: 15 min (off/on)  LATCH Score/Interventions Latch: Repeated attempts needed to sustain latch, nipple held in mouth throughout feeding, stimulation needed to elicit sucking reflex. Intervention(s): Adjust position;Assist with latch;Breast massage;Breast compression  Audible Swallowing: None  Type of Nipple: Everted at rest and after stimulation (right nipple everted, short shaft, left nipple flat)  Comfort (Breast/Nipple): Soft / non-tender  Interventions  (Mild/moderate discomfort): Pre-pump if needed  Hold (Positioning): Assistance needed to correctly position infant at breast and maintain latch. Intervention(s): Breastfeeding basics reviewed;Support Pillows;Position options;Skin to skin  LATCH Score: 6  Lactation Tools Discussed/Used Tools: Pump Breast pump type: Manual WIC Program: No   Consult Status Consult Status: Follow-up Date: 12/19/15 Follow-up type: In-patient    Alfred LevinsGranger, [place] Pechacek Ann 12/18/2015, 1:06 PM

## 2015-12-18 NOTE — Anesthesia Postprocedure Evaluation (Signed)
Anesthesia Post Note  Patient: Mary GeroldMaridee Frye  Procedure(s) Performed: Procedure(s) (LRB): CESAREAN SECTION (N/A)  Patient location during evaluation: Mother Baby Anesthesia Type: Epidural Level of consciousness: awake Pain management: satisfactory to patient Vital Signs Assessment: post-procedure vital signs reviewed and stable Respiratory status: spontaneous breathing Cardiovascular status: stable Anesthetic complications: no    Last Vitals:  Filed Vitals:   12/18/15 0525 12/18/15 0843  BP: 109/67 108/54  Pulse: 66 62  Temp:  36.5 C  Resp:  18    Last Pain:  Filed Vitals:   12/18/15 0844  PainSc: 3                  Alexsandra Shontz

## 2015-12-18 NOTE — Transfer of Care (Signed)
Immediate Anesthesia Transfer of Care Note  Patient: Mary Frye  Procedure(s) Performed: Procedure(s): CESAREAN SECTION (N/A)  Patient Location: PACU  Anesthesia Type:Epidural  Level of Consciousness: awake, alert , oriented and patient cooperative  Airway & Oxygen Therapy: Patient Spontanous Breathing  Post-op Assessment: Report given to RN and Post -op Vital signs reviewed and stable  Post vital signs: Reviewed and stable  Last Vitals:  Filed Vitals:   12/17/15 2200 12/17/15 2201  BP:  114/69  Pulse:  74  Temp: 36.8 C   Resp: 16     Complications: No apparent anesthesia complications

## 2015-12-18 NOTE — Progress Notes (Signed)
Subjective: Postpartum Day 1: Cesarean Delivery Patient reports incisional pain and tolerating PO.    Objective: Vital signs in last 24 hours: Temp:  [97.4 F (36.3 C)-98.7 F (37.1 C)] 98 F (36.7 C) (04/25 0515) Pulse Rate:  [60-95] 66 (04/25 0525) Resp:  [15-25] 18 (04/25 0515) BP: (84-137)/(47-107) 109/67 mmHg (04/25 0525) SpO2:  [92 %-100 %] 97 % (04/25 0515)  Physical Exam:  General: alert and cooperative Lochia: appropriate, reports passed large clot earlier this am, after returning from BR. No further clots Uterine Fundus: firm Incision: healing well DVT Evaluation: No evidence of DVT seen on physical exam. Negative Homan's sign. No cords or calf tenderness. No significant calf/ankle edema.   Recent Labs  12/17/15 0055 12/18/15 0501  HGB 12.9 12.6  HCT 36.3 35.4*    Assessment/Plan: Status post Cesarean section. Doing well postoperatively.  Continue current care.  Ayodele Hartsock G 12/18/2015, 7:20 AM

## 2015-12-19 NOTE — Progress Notes (Signed)
Subjective: Postpartum Day 2: Cesarean Delivery Patient reports tolerating PO, + flatus and no problems voiding.    Objective: Vital signs in last 24 hours: Temp:  [97 F (36.1 C)-98.5 F (36.9 C)] 97.6 F (36.4 C) (04/26 0629) Pulse Rate:  [58-80] 80 (04/26 0629) Resp:  [17-20] 18 (04/26 0629) BP: (108-124)/(54-76) 115/76 mmHg (04/26 0629) SpO2:  [94 %-97 %] 97 % (04/25 2024)  Physical Exam:  General: alert and cooperative Lochia: appropriate Uterine Fundus: firm Incision: healing well DVT Evaluation: No evidence of DVT seen on physical exam. Negative Homan's sign. No cords or calf tenderness. No significant calf/ankle edema.   Recent Labs  12/17/15 0055 12/18/15 0501  HGB 12.9 12.6  HCT 36.3 35.4*    Assessment/Plan: Status post Cesarean section. Doing well postoperatively.  Continue current care.  Delancey Moraes G 12/19/2015, 7:54 AM

## 2015-12-19 NOTE — Lactation Note (Addendum)
This note was copied from a baby's chart. Lactation Consultation Note  Patient Name: Mary Frye [id number]'s Date: 12/19/2015 Reason for consult: Follow-up assessment Baby at 42 hr of life and mom reports bf much better today. Mom is wearing shells. She was able to latch baby in the football position with no issues, she did not use the NS. She had questions about her DEBP. Helped her set everything up and she was able to get 5 ml from the side that baby did not latch on. Demonstrated spoon feeding. Mom asked about syring feeding, given syring and demonstration. Discussed baby behavior, feeding frequency, baby belly size, voids, wt loss, breast changes, and nipple care. She is aware of OP services and support group.    Maternal Data    Feeding Feeding Type: Breast Fed Length of feed: 10 min  LATCH Score/Interventions Latch: Grasps breast easily, tongue down, lips flanged, rhythmical sucking. Intervention(s): Teach feeding cues;Skin to skin Intervention(s): Adjust position  Audible Swallowing: Spontaneous and intermittent Intervention(s): Hand expression  Type of Nipple: Everted at rest and after stimulation Intervention(s): Shells  Comfort (Breast/Nipple): Soft / non-tender  Problem noted: Mild/Moderate discomfort Interventions (Mild/moderate discomfort): Hand expression  Hold (Positioning): No assistance needed to correctly position infant at breast.  LATCH Score: 10  Lactation Tools Discussed/Used     Consult Status Consult Status: Follow-up Date: 12/20/15 Follow-up type: In-patient    Mary Frye 12/19/2015, 6:07 PM

## 2015-12-20 MED ORDER — OXYCODONE-ACETAMINOPHEN 5-325 MG PO TABS: 1.0000 | ORAL_TABLET | ORAL | Status: DC | PRN

## 2015-12-20 MED ORDER — IBUPROFEN 600 MG PO TABS: 600.0000 mg | ORAL_TABLET | Freq: Four times a day (QID) | ORAL | Status: DC

## 2015-12-20 NOTE — Discharge Summary (Signed)
Obstetric Discharge Summary Reason for Admission: induction of labor Prenatal Procedures: ultrasound Intrapartum Procedures: cesarean: low cervical, transverse Postpartum Procedures: none Complications-Operative and Postpartum: none HEMOGLOBIN  Date Value Ref Range Status  12/18/2015 12.6 12.0 - 15.0 g/dL Final   HCT  Date Value Ref Range Status  12/18/2015 35.4* 36.0 - 46.0 % Final    Physical Exam:  General: alert and cooperative Lochia: appropriate Uterine Fundus: firm Incision: healing well DVT Evaluation: No evidence of DVT seen on physical exam. Negative Homan's sign. No cords or calf tenderness. Calf/Ankle edema is present.  Discharge Diagnoses: Term Pregnancy-delivered  Discharge Information: Date: 12/20/2015 Activity: pelvic rest Diet: routine Medications: PNV, Ibuprofen and Percocet Condition: stable Instructions: refer to practice specific booklet Discharge to: home   Newborn Data: Live born female  Birth Weight: 8 lb 12.9 oz (3995 g) APGAR: 8, 9  Home with mother.  Mary Frye G 12/20/2015, 7:56 AM

## 2015-12-20 NOTE — Lactation Note (Signed)
This note was copied from a baby's chart. Lactation Consultation Note  Follow up visit made prior to discharge.  Mom states breastfeeding is going well and baby has been nursing frequently.  Reviewed basics and discharge teaching including engorgement treatment.  Lactation outpatient services and support information reviewed and encouraged.  Patient Name: Boy Noel GeroldMaridee Bourbeau [id number]'YToday's Date: 12/20/2015     Maternal Data    Feeding Feeding Type: Breast Fed Length of feed: 25 min  LATCH Score/Interventions                      Lactation Tools Discussed/Used     Consult Status      Huston FoleyMOULDEN, Gerardo Territo S 12/20/2015, 9:44 AM

## 2017-04-21 LAB — OB RESULTS CONSOLE HEPATITIS B SURFACE ANTIGEN: HEP B S AG: NEGATIVE

## 2017-04-21 LAB — OB RESULTS CONSOLE RUBELLA ANTIBODY, IGM: RUBELLA: IMMUNE

## 2017-04-21 LAB — OB RESULTS CONSOLE HIV ANTIBODY (ROUTINE TESTING): HIV: NONREACTIVE

## 2017-04-21 LAB — OB RESULTS CONSOLE ABO/RH: RH Type: POSITIVE

## 2017-04-21 LAB — OB RESULTS CONSOLE GC/CHLAMYDIA
CHLAMYDIA, DNA PROBE: NEGATIVE
GC PROBE AMP, GENITAL: NEGATIVE

## 2017-04-21 LAB — OB RESULTS CONSOLE RPR: RPR: NONREACTIVE

## 2017-04-21 LAB — OB RESULTS CONSOLE ANTIBODY SCREEN: ANTIBODY SCREEN: NEGATIVE

## 2017-10-28 ENCOUNTER — Encounter (HOSPITAL_COMMUNITY): Payer: Self-pay | Admitting: *Deleted

## 2017-10-29 NOTE — H&P (Addendum)
Mary Frye is a 37 y.o. female presenting for repeat c-section.  Declines TOLAC Pregnancy uncomplicated.   OB History    Gravida Para Term Preterm AB Living   3 1 1   1 1    SAB TAB Ectopic Multiple Live Births   1     0 1     Past Medical History:  Diagnosis Date  . History of back injury    horse injury trt with physical therapy  . History of concussion    years ago horse injury  . Hx of varicella   . Supraumbilical hernia    small  . Vaginal Pap smear, abnormal    Past Surgical History:  Procedure Laterality Date  . CESAREAN SECTION N/A 12/17/2015   Procedure: CESAREAN SECTION;  Surgeon: Harold HedgeJames Tomblin, MD;  Location: [facility];  Service: Obstetrics;  Laterality: N/A;  . dental implants     37 yo  . LEEP     Family History: family history includes Cancer in her maternal aunt and maternal aunt; Leukemia in her father; Seizures in her maternal aunt. Social History:  reports that  has never smoked. she has never used smokeless tobacco. She reports that she does not drink alcohol or use drugs.     Maternal Diabetes: No Genetic Screening: Normal Maternal Ultrasounds/Referrals: Normal Fetal Ultrasounds or other Referrals:  None Maternal Substance Abuse:  No Significant Maternal Medications:  None Significant Maternal Lab Results:  None Other Comments:  None  ROS History    Exam Physical Exam  Gen - NAD Abd - gravid, NT Ext - NT  Prenatal labs: ABO, Rh: O/Positive/-- (08/28 0000) Antibody: Negative (08/28 0000) Rubella: Immune (08/28 0000) RPR: Nonreactive (08/28 0000)  HBsAg: Negative (08/28 0000)  HIV: Non-reactive (08/28 0000)  GBS:     Assessment/Plan: Previous c-section, desires repeat  R/b/a discussed, questions answered, informed consent  Mary Frye 10/29/2017, 2:04 PM

## 2017-11-10 NOTE — Patient Instructions (Signed)
Mary GeroldMaridee Frye  11/10/2017   Your procedure is scheduled on:  11/12/2017  [facility] at 0530 AM.  Pick up the phone at the desk and dial [phone]  Call this number if you have problems the morning of surgery:[phone]  Remember:   Do not eat food:(After Midnight) Desps de medianoche.  Do not drink clear liquids: (After Midnight) Desps de medianoche.  Take these medicines the morning of surgery with A SIP OF WATER: none   Do not wear jewelry, make-up or nail polish.  Do not wear lotions, powders, or perfumes. Do not wear deodorant.  Do not shave 48 hours prior to surgery.  Do not bring valuables to the hospital.  [facility] is not   responsible for any belongings or valuables brought to the hospital.  Contacts, dentures or bridgework may not be worn into surgery.  Leave suitcase in the car. After surgery it may be brought to your room.  For patients admitted to the hospital, checkout time is 11:00 AM the day of              discharge.    N/A   Please read over the following fact sheets that you were given:   Surgical Site Infection Prevention

## 2017-11-11 ENCOUNTER — Encounter (HOSPITAL_COMMUNITY)
Admission: RE | Admit: 2017-11-11 | Discharge: 2017-11-11 | Disposition: A | Discharge status: Home / Self Care | Payer: Managed Care, Other (non HMO) | Source: Ambulatory Visit | Attending: Obstetrics and Gynecology | Admitting: Obstetrics and Gynecology

## 2017-11-11 ENCOUNTER — Encounter (HOSPITAL_COMMUNITY): Payer: Self-pay

## 2017-11-11 HISTORY — DX: Ventral hernia without obstruction or gangrene: K43.9

## 2017-11-11 LAB — CBC
HEMATOCRIT: 36.2 % (ref 36.0–46.0)
Hemoglobin: 12.8 g/dL (ref 12.0–15.0)
MCH: 33 pg (ref 26.0–34.0)
MCHC: 35.4 g/dL (ref 30.0–36.0)
MCV: 93.3 fL (ref 78.0–100.0)
Platelets: 264 10*3/uL (ref 150–400)
RBC: 3.88 MIL/uL (ref 3.87–5.11)
RDW: 13.2 % (ref 11.5–15.5)
WBC: 9.1 10*3/uL (ref 4.0–10.5)

## 2017-11-11 LAB — TYPE AND SCREEN
ABO/RH(D): O POS
ANTIBODY SCREEN: NEGATIVE

## 2017-11-12 ENCOUNTER — Inpatient Hospital Stay (HOSPITAL_COMMUNITY): Payer: Managed Care, Other (non HMO) | Admitting: Anesthesiology

## 2017-11-12 ENCOUNTER — Encounter (HOSPITAL_COMMUNITY): Payer: Self-pay

## 2017-11-12 ENCOUNTER — Inpatient Hospital Stay (HOSPITAL_COMMUNITY)
Admission: AD | Admit: 2017-11-12 | Discharge: 2017-11-15 | DRG: 788 | Disposition: A | Discharge status: Home / Self Care | Payer: Managed Care, Other (non HMO) | Source: Ambulatory Visit | Attending: Obstetrics and Gynecology | Admitting: Obstetrics and Gynecology

## 2017-11-12 ENCOUNTER — Encounter (HOSPITAL_COMMUNITY)
Admission: AD | Disposition: A | Discharge status: Home / Self Care | Payer: Self-pay | Source: Ambulatory Visit | Attending: Obstetrics and Gynecology

## 2017-11-12 DIAGNOSIS — Z3A39 39 weeks gestation of pregnancy: Secondary | ICD-10-CM | POA: Diagnosis not present

## 2017-11-12 DIAGNOSIS — O34211 Maternal care for low transverse scar from previous cesarean delivery: Principal | ICD-10-CM | POA: Diagnosis present

## 2017-11-12 DIAGNOSIS — Z98891 History of uterine scar from previous surgery: Secondary | ICD-10-CM

## 2017-11-12 LAB — RPR: RPR Ser Ql: NONREACTIVE

## 2017-11-12 SURGERY — Surgical Case
Anesthesia: Spinal

## 2017-11-12 MED ORDER — NALBUPHINE HCL 10 MG/ML IJ SOLN: 5.0000 mg | INTRAMUSCULAR | Status: DC | PRN

## 2017-11-12 MED ORDER — MEPERIDINE HCL 25 MG/ML IJ SOLN: 6.2500 mg | INTRAMUSCULAR | Status: DC | PRN

## 2017-11-12 MED ORDER — SIMETHICONE 80 MG PO CHEW
80.0000 mg | CHEWABLE_TABLET | Freq: Three times a day (TID) | ORAL | Status: DC
  Administered 2017-11-12 – 2017-11-15 (×9): 80 mg via ORAL
  Filled 2017-11-12 (×9): qty 1

## 2017-11-12 MED ORDER — MEDROXYPROGESTERONE ACETATE 150 MG/ML IM SUSP: 150.0000 mg | INTRAMUSCULAR | Status: DC | PRN

## 2017-11-12 MED ORDER — OXYTOCIN 10 UNIT/ML IJ SOLN
INTRAMUSCULAR | Status: AC
  Filled 2017-11-12: qty 4

## 2017-11-12 MED ORDER — MORPHINE SULFATE (PF) 0.5 MG/ML IJ SOLN
INTRAMUSCULAR | Status: DC | PRN
  Administered 2017-11-12: .2 mg via INTRATHECAL

## 2017-11-12 MED ORDER — SIMETHICONE 80 MG PO CHEW
80.0000 mg | CHEWABLE_TABLET | ORAL | Status: DC
  Administered 2017-11-13 – 2017-11-15 (×3): 80 mg via ORAL
  Filled 2017-11-12 (×3): qty 1

## 2017-11-12 MED ORDER — NALOXONE HCL 4 MG/10ML IJ SOLN: 1.0000 ug/kg/h | INTRAVENOUS | Status: DC | PRN

## 2017-11-12 MED ORDER — PHENYLEPHRINE 8 MG IN D5W 100 ML (0.08MG/ML) PREMIX OPTIME
INJECTION | INTRAVENOUS | Status: AC
  Filled 2017-11-12: qty 100

## 2017-11-12 MED ORDER — DIPHENHYDRAMINE HCL 25 MG PO CAPS
25.0000 mg | ORAL_CAPSULE | ORAL | Status: DC | PRN
  Filled 2017-11-12: qty 1

## 2017-11-12 MED ORDER — ONDANSETRON HCL 4 MG/2ML IJ SOLN
INTRAMUSCULAR | Status: AC
  Filled 2017-11-12: qty 2

## 2017-11-12 MED ORDER — SCOPOLAMINE 1 MG/3DAYS TD PT72
1.0000 | MEDICATED_PATCH | Freq: Once | TRANSDERMAL | Status: AC
  Administered 2017-11-12: 1.5 mg via TRANSDERMAL
  Filled 2017-11-12: qty 1

## 2017-11-12 MED ORDER — FENTANYL CITRATE (PF) 100 MCG/2ML IJ SOLN: 25.0000 ug | INTRAMUSCULAR | Status: DC | PRN

## 2017-11-12 MED ORDER — PHENYLEPHRINE 40 MCG/ML (10ML) SYRINGE FOR IV PUSH (FOR BLOOD PRESSURE SUPPORT)
PREFILLED_SYRINGE | INTRAVENOUS | Status: AC
  Filled 2017-11-12: qty 10

## 2017-11-12 MED ORDER — SODIUM CHLORIDE 0.9% FLUSH: 3.0000 mL | INTRAVENOUS | Status: DC | PRN

## 2017-11-12 MED ORDER — MORPHINE SULFATE (PF) 0.5 MG/ML IJ SOLN
INTRAMUSCULAR | Status: AC
  Filled 2017-11-12: qty 10

## 2017-11-12 MED ORDER — DEXAMETHASONE SODIUM PHOSPHATE 4 MG/ML IJ SOLN
INTRAMUSCULAR | Status: AC
  Filled 2017-11-12: qty 1

## 2017-11-12 MED ORDER — MENTHOL 3 MG MT LOZG: 1.0000 | LOZENGE | OROMUCOSAL | Status: DC | PRN

## 2017-11-12 MED ORDER — PRENATAL MULTIVITAMIN CH
1.0000 | ORAL_TABLET | Freq: Every day | ORAL | Status: DC
  Administered 2017-11-12 – 2017-11-15 (×4): 1 via ORAL
  Filled 2017-11-12 (×4): qty 1

## 2017-11-12 MED ORDER — PHENYLEPHRINE 8 MG IN D5W 100 ML (0.08MG/ML) PREMIX OPTIME
INJECTION | INTRAVENOUS | Status: DC | PRN
  Administered 2017-11-12: 60 ug/min via INTRAVENOUS

## 2017-11-12 MED ORDER — NALOXONE HCL 0.4 MG/ML IJ SOLN: 0.4000 mg | INTRAMUSCULAR | Status: DC | PRN

## 2017-11-12 MED ORDER — SIMETHICONE 80 MG PO CHEW
80.0000 mg | CHEWABLE_TABLET | ORAL | Status: DC | PRN
  Filled 2017-11-12: qty 1

## 2017-11-12 MED ORDER — LACTATED RINGERS IV SOLN: INTRAVENOUS | Status: DC

## 2017-11-12 MED ORDER — WITCH HAZEL-GLYCERIN EX PADS: 1.0000 "application " | MEDICATED_PAD | CUTANEOUS | Status: DC | PRN

## 2017-11-12 MED ORDER — PROMETHAZINE HCL 25 MG/ML IJ SOLN: 6.2500 mg | INTRAMUSCULAR | Status: DC | PRN

## 2017-11-12 MED ORDER — IBUPROFEN 600 MG PO TABS
600.0000 mg | ORAL_TABLET | Freq: Four times a day (QID) | ORAL | Status: DC
  Administered 2017-11-12 – 2017-11-15 (×10): 600 mg via ORAL
  Filled 2017-11-12 (×10): qty 1

## 2017-11-12 MED ORDER — LACTATED RINGERS IV SOLN
INTRAVENOUS | Status: DC | PRN
  Administered 2017-11-12: 08:00:00 via INTRAVENOUS

## 2017-11-12 MED ORDER — DEXAMETHASONE SODIUM PHOSPHATE 4 MG/ML IJ SOLN
INTRAMUSCULAR | Status: DC | PRN
  Administered 2017-11-12: 4 mg via INTRAVENOUS

## 2017-11-12 MED ORDER — MEASLES, MUMPS & RUBELLA VAC ~~LOC~~ INJ: 0.5000 mL | INJECTION | Freq: Once | SUBCUTANEOUS | Status: DC

## 2017-11-12 MED ORDER — TETANUS-DIPHTH-ACELL PERTUSSIS 5-2.5-18.5 LF-MCG/0.5 IM SUSP: 0.5000 mL | Freq: Once | INTRAMUSCULAR | Status: DC

## 2017-11-12 MED ORDER — DIPHENHYDRAMINE HCL 50 MG/ML IJ SOLN: 12.5000 mg | INTRAMUSCULAR | Status: DC | PRN

## 2017-11-12 MED ORDER — DIBUCAINE 1 % RE OINT: 1.0000 "application " | TOPICAL_OINTMENT | RECTAL | Status: DC | PRN

## 2017-11-12 MED ORDER — NALBUPHINE HCL 10 MG/ML IJ SOLN: 5.0000 mg | Freq: Once | INTRAMUSCULAR | Status: DC | PRN

## 2017-11-12 MED ORDER — ONDANSETRON HCL 4 MG/2ML IJ SOLN
INTRAMUSCULAR | Status: DC | PRN
Start: 2017-11-12 — End: 2017-11-12
  Administered 2017-11-12: 4 mg via INTRAVENOUS

## 2017-11-12 MED ORDER — CEFAZOLIN SODIUM-DEXTROSE 2-4 GM/100ML-% IV SOLN
2.0000 g | INTRAVENOUS | Status: AC
  Administered 2017-11-12: 2 g via INTRAVENOUS
  Filled 2017-11-12: qty 100

## 2017-11-12 MED ORDER — SENNOSIDES-DOCUSATE SODIUM 8.6-50 MG PO TABS
2.0000 | ORAL_TABLET | ORAL | Status: DC
  Administered 2017-11-13 – 2017-11-15 (×3): 2 via ORAL
  Filled 2017-11-12 (×3): qty 2

## 2017-11-12 MED ORDER — ONDANSETRON HCL 4 MG/2ML IJ SOLN: 4.0000 mg | Freq: Three times a day (TID) | INTRAMUSCULAR | Status: DC | PRN

## 2017-11-12 MED ORDER — DIPHENHYDRAMINE HCL 25 MG PO CAPS: 25.0000 mg | ORAL_CAPSULE | Freq: Four times a day (QID) | ORAL | Status: DC | PRN

## 2017-11-12 MED ORDER — OXYTOCIN 40 UNITS IN LACTATED RINGERS INFUSION - SIMPLE MED: 2.5000 [IU]/h | INTRAVENOUS | Status: AC

## 2017-11-12 MED ORDER — KETOROLAC TROMETHAMINE 30 MG/ML IJ SOLN: 30.0000 mg | Freq: Four times a day (QID) | INTRAMUSCULAR | Status: AC | PRN

## 2017-11-12 MED ORDER — OXYTOCIN 10 UNIT/ML IJ SOLN
INTRAMUSCULAR | Status: DC | PRN
  Administered 2017-11-12: 40 [IU] via INTRAVENOUS

## 2017-11-12 MED ORDER — FENTANYL CITRATE (PF) 100 MCG/2ML IJ SOLN
INTRAMUSCULAR | Status: DC | PRN
  Administered 2017-11-12: 10 ug via INTRATHECAL

## 2017-11-12 MED ORDER — ACETAMINOPHEN 325 MG PO TABS
650.0000 mg | ORAL_TABLET | ORAL | Status: DC | PRN
  Administered 2017-11-14: 650 mg via ORAL
  Filled 2017-11-12 (×2): qty 2

## 2017-11-12 MED ORDER — OXYCODONE-ACETAMINOPHEN 5-325 MG PO TABS
1.0000 | ORAL_TABLET | ORAL | Status: DC | PRN
  Administered 2017-11-12 – 2017-11-14 (×4): 1 via ORAL
  Filled 2017-11-12 (×4): qty 1

## 2017-11-12 MED ORDER — KETOROLAC TROMETHAMINE 30 MG/ML IJ SOLN
30.0000 mg | Freq: Four times a day (QID) | INTRAMUSCULAR | Status: AC | PRN
  Administered 2017-11-12: 30 mg via INTRAMUSCULAR

## 2017-11-12 MED ORDER — KETOROLAC TROMETHAMINE 30 MG/ML IJ SOLN
INTRAMUSCULAR | Status: AC
  Filled 2017-11-12: qty 1

## 2017-11-12 MED ORDER — DEXTROSE IN LACTATED RINGERS 5 % IV SOLN
INTRAVENOUS | Status: DC
  Administered 2017-11-12: 18:00:00 via INTRAVENOUS

## 2017-11-12 MED ORDER — LACTATED RINGERS IV SOLN
INTRAVENOUS | Status: DC
  Administered 2017-11-12 (×2): via INTRAVENOUS

## 2017-11-12 MED ORDER — SODIUM CHLORIDE 0.9 % IR SOLN
Status: DC | PRN
  Administered 2017-11-12: 1

## 2017-11-12 MED ORDER — FENTANYL CITRATE (PF) 100 MCG/2ML IJ SOLN
INTRAMUSCULAR | Status: AC
  Filled 2017-11-12: qty 2

## 2017-11-12 MED ORDER — OXYCODONE-ACETAMINOPHEN 5-325 MG PO TABS
2.0000 | ORAL_TABLET | ORAL | Status: DC | PRN
  Administered 2017-11-13 – 2017-11-15 (×10): 2 via ORAL
  Filled 2017-11-12 (×11): qty 2

## 2017-11-12 MED ORDER — BUPIVACAINE IN DEXTROSE 0.75-8.25 % IT SOLN
INTRATHECAL | Status: DC | PRN
  Administered 2017-11-12: 1.6 mL via INTRATHECAL

## 2017-11-12 MED ORDER — COCONUT OIL OIL
1.0000 "application " | TOPICAL_OIL | Status: DC | PRN
  Filled 2017-11-12: qty 120

## 2017-11-12 SURGICAL SUPPLY — 33 items
BENZOIN TINCTURE PRP APPL 2/3 (GAUZE/BANDAGES/DRESSINGS) ×3 IMPLANT
CHLORAPREP W/TINT 26ML (MISCELLANEOUS) ×3 IMPLANT
CLAMP CORD UMBIL (MISCELLANEOUS) IMPLANT
CLOSURE WOUND 1/2 X4 (GAUZE/BANDAGES/DRESSINGS) ×1
CLOTH BEACON ORANGE TIMEOUT ST (SAFETY) ×3 IMPLANT
DERMABOND ADVANCED (GAUZE/BANDAGES/DRESSINGS) ×2
DERMABOND ADVANCED .7 DNX12 (GAUZE/BANDAGES/DRESSINGS) ×1 IMPLANT
DRSG OPSITE POSTOP 4X10 (GAUZE/BANDAGES/DRESSINGS) ×3 IMPLANT
ELECT REM PT RETURN 9FT ADLT (ELECTROSURGICAL) ×3
ELECTRODE REM PT RTRN 9FT ADLT (ELECTROSURGICAL) ×1 IMPLANT
EXTRACTOR VACUUM M CUP 4 TUBE (SUCTIONS) IMPLANT
EXTRACTOR VACUUM M CUP 4' TUBE (SUCTIONS)
GLOVE BIO SURGEON STRL SZ 6.5 (GLOVE) ×2 IMPLANT
GLOVE BIO SURGEONS STRL SZ 6.5 (GLOVE) ×1
GLOVE BIOGEL PI IND STRL 7.0 (GLOVE) ×2 IMPLANT
GLOVE BIOGEL PI INDICATOR 7.0 (GLOVE) ×4
GOWN STRL REUS W/TWL LRG LVL3 (GOWN DISPOSABLE) ×6 IMPLANT
KIT ABG SYR 3ML LUER SLIP (SYRINGE) IMPLANT
NEEDLE HYPO 25X5/8 SAFETYGLIDE (NEEDLE) IMPLANT
NS IRRIG 1000ML POUR BTL (IV SOLUTION) ×3 IMPLANT
PACK C SECTION WH (CUSTOM PROCEDURE TRAY) ×3 IMPLANT
PAD OB MATERNITY 4.3X12.25 (PERSONAL CARE ITEMS) ×3 IMPLANT
PENCIL SMOKE EVAC W/HOLSTER (ELECTROSURGICAL) ×3 IMPLANT
STRIP CLOSURE SKIN 1/2X4 (GAUZE/BANDAGES/DRESSINGS) ×2 IMPLANT
SUT CHROMIC 0 CT 802H (SUTURE) IMPLANT
SUT CHROMIC 0 CTX 36 (SUTURE) ×12 IMPLANT
SUT MON AB-0 CT1 36 (SUTURE) ×3 IMPLANT
SUT PDS AB 0 CTX 60 (SUTURE) ×3 IMPLANT
SUT PLAIN 0 NONE (SUTURE) IMPLANT
SUT VIC AB 4-0 KS 27 (SUTURE) IMPLANT
SYR BULB 3OZ (MISCELLANEOUS) ×3 IMPLANT
TOWEL OR 17X24 6PK STRL BLUE (TOWEL DISPOSABLE) ×3 IMPLANT
TRAY FOLEY BAG SILVER LF 14FR (SET/KITS/TRAYS/PACK) IMPLANT

## 2017-11-12 NOTE — Addendum Note (Signed)
Addendum  created 11/12/17 1718 by Graciela HusbandsFussell, Mckensie Scotti O, CRNA   Sign clinical note

## 2017-11-12 NOTE — Anesthesia Postprocedure Evaluation (Signed)
Anesthesia Post Note  Patient: Mary GeroldMaridee Frye  Procedure(s) Performed: REPEAT CESAREAN SECTION (N/A )     Patient location during evaluation: Mother Baby Anesthesia Type: Spinal Level of consciousness: awake and alert and oriented Pain management: satisfactory to patient Vital Signs Assessment: post-procedure vital signs reviewed and stable Respiratory status: respiratory function stable and spontaneous breathing Cardiovascular status: blood pressure returned to baseline Postop Assessment: no headache, no backache, spinal receding, patient able to bend at knees and adequate PO intake Anesthetic complications: no    Last Vitals:  Vitals:   11/12/17 1115 11/12/17 1315  BP: 114/66 117/64  Pulse: 71 62  Resp: 18 18  Temp: (!) 36.4 C 36.8 C  SpO2: 96% 96%    Last Pain:  Vitals:   11/12/17 1315  TempSrc: Oral  PainSc: 4    Pain Goal:                 Khalik Pewitt

## 2017-11-12 NOTE — Addendum Note (Signed)
Addendum  created 11/12/17 1137 by Shelton SilvasHollis, Khasir Woodrome D, MD   Sign clinical note

## 2017-11-12 NOTE — Transfer of Care (Signed)
Immediate Anesthesia Transfer of Care Note  Patient: [name]  Procedure(s) Performed: REPEAT CESAREAN SECTION (N/A )  Patient Location: PACU  Anesthesia Type:Spinal  Level of Consciousness: awake, alert  and oriented  Airway & Oxygen Therapy: Patient Spontanous Breathing  Post-op Assessment: Report given to RN and Post -op Vital signs reviewed and stable  Post vital signs: Reviewed and stable  Last Vitals:  Vitals Value Taken Time  BP 85/60 11/12/2017  8:34 AM  Temp    Pulse 80 11/12/2017  8:37 AM  Resp 14 11/12/2017  8:37 AM  SpO2 97 % 11/12/2017  8:37 AM  Vitals shown include unvalidated device data.  Last Pain:  Vitals:   11/12/17 0554  TempSrc: Oral  PainSc: 0-No pain         Complications: No apparent anesthesia complications

## 2017-11-12 NOTE — Anesthesia Postprocedure Evaluation (Addendum)
Anesthesia Post Note  Patient: Mary GeroldMaridee Frye  Procedure(s) Performed: REPEAT CESAREAN SECTION (N/A )     Patient location during evaluation: PACU Anesthesia Type: Spinal Level of consciousness: oriented and awake and alert Pain management: pain level controlled Vital Signs Assessment: post-procedure vital signs reviewed and stable Respiratory status: spontaneous breathing, respiratory function stable and patient connected to nasal cannula oxygen Cardiovascular status: blood pressure returned to baseline and stable Postop Assessment: no headache, no backache, no apparent nausea or vomiting, spinal receding and patient able to bend at knees Anesthetic complications: no    Last Vitals:  Vitals:   11/12/17 1008 11/12/17 1015  BP:  (!) 97/59  Pulse: 74 74  Resp: 16 18  Temp:  (!) 36.4 C  SpO2: 97% 97%    Last Pain:  Vitals:   11/12/17 1015  TempSrc: Oral  PainSc: 3    Pain Goal:                 Shelton SilvasKevin D Tinaya Ceballos

## 2017-11-12 NOTE — Anesthesia Preprocedure Evaluation (Addendum)
Anesthesia Evaluation  Patient identified by MRN, date of birth, ID band Patient awake    Reviewed: Allergy & Precautions, NPO status , Patient's Chart, lab work & pertinent test results  Airway Mallampati: I  TM Distance: >3 FB Neck ROM: Full    Dental  (+) Teeth Intact, Dental Advisory Given   Pulmonary neg pulmonary ROS,    breath sounds clear to auscultation       Cardiovascular negative cardio ROS   Rhythm:Regular Rate:Normal     Neuro/Psych negative neurological ROS     GI/Hepatic negative GI ROS, Neg liver ROS,   Endo/Other  negative endocrine ROS  Renal/GU negative Renal ROS     Musculoskeletal   Abdominal Normal abdominal exam  (+)   Peds  Hematology negative hematology ROS (+)   Anesthesia Other Findings   Reproductive/Obstetrics (+) Pregnancy                            Lab Results  Component Value Date   WBC 9.1 11/11/2017   HGB 12.8 11/11/2017   HCT 36.2 11/11/2017   MCV 93.3 11/11/2017   PLT 264 11/11/2017     Anesthesia Physical Anesthesia Plan  ASA: II  Anesthesia Plan: Spinal   Post-op Pain Management:    Induction:   PONV Risk Score and Plan: 2 and Ondansetron and Treatment may vary due to age or medical condition  Airway Management Planned: Nasal Cannula  Additional Equipment: None  Intra-op Plan:   Post-operative Plan:   Informed Consent: I have reviewed the patients History and Physical, chart, labs and discussed the procedure including the risks, benefits and alternatives for the proposed anesthesia with the patient or authorized representative who has indicated his/her understanding and acceptance.     Plan Discussed with: CRNA  Anesthesia Plan Comments:        Anesthesia Quick Evaluation

## 2017-11-12 NOTE — Lactation Note (Signed)
This note was copied from a baby's chart. Lactation Consultation Note  Patient Name: Mary Frye WUJWJ'XToday's Date: 11/12/2017 Reason for consult: Initial assessment;Term;Other (Comment)(DAT (+))  8 hours old FT female who is being exclusively BF by his mother, she's a P2 and BF her other baby for 12 months, she's experienced BF. Baby is DAT (+) and mom is aware of that, explained to mom to importance of feeding baby consistently due to his DAT status, baby took a 4 hours nap today.   Assisted with latch, mom's nipples are both intact; she stated the feedings at the breast were comfortable. Using her Boppy pillow she latched baby STS on the right breast and he was able to sustain the latch throughout the entire feeding, baby was still nursing when exiting the room.  Encouraged mom to feed baby on cues STS 8-12 times/24 hours. Mom and grandma reported all questions were answered. Reviewed BF brochure, BF resources and feeding diary, mom is aware of LC services and will call PRN.  Maternal Data Formula Feeding for Exclusion: No Has patient been taught Hand Expression?: Yes Does the patient have breastfeeding experience prior to this delivery?: Yes  Feeding Feeding Type: Breast Fed Length of feed: 20 min(Baby was still nursing when exiting the room)  LATCH Score Latch: Grasps breast easily, tongue down, lips flanged, rhythmical sucking.  Audible Swallowing: A few with stimulation  Type of Nipple: Everted at rest and after stimulation  Comfort (Breast/Nipple): Soft / non-tender  Hold (Positioning): No assistance needed to correctly position infant at breast.  LATCH Score: 9  Interventions Interventions: Breast feeding basics reviewed;Assisted with latch;Skin to skin;Breast massage;Breast compression;Adjust position;Support pillows;Position options  Lactation Tools Discussed/Used WIC Program: No   Consult Status Consult Status: Follow-up Date: 11/13/17 Follow-up type:  In-patient    [name] Venetia ConstableS [name] 11/12/2017, 4:04 PM

## 2017-11-12 NOTE — Op Note (Signed)
Cesarean Section Procedure Note   Noel GeroldMaridee Zeis  11/12/2017  Indications: Scheduled Proceedure/Maternal Request   Pre-operative Diagnosis: PREVIOUS x 1.   Post-operative Diagnosis: Same   Surgeon: Surgeon(s) and Role:    Zelphia Cairo* Bryon Parker, MD - Primary   Assistants: Odelia GageHeather K, RNFA  Anesthesia: spinal, Hart RochesterHollis MD  Procedure Details:  The patient was seen in the Holding Room. The risks, benefits, complications, treatment options, and expected outcomes were discussed with the patient. The patient concurred with the proposed plan, giving informed consent. identified as Noel GeroldMaridee Aubert and the procedure verified as C-Section Delivery. A Time Out was held and the above information confirmed.  After induction of anesthesia, the patient was draped and prepped in the usual sterile manner. A transverse was made and carried down through the subcutaneous tissue to the fascia. Fascial incision was made and extended transversely. The fascia was separated from the underlying rectus tissue superiorly and inferiorly. The peritoneum was identified and entered. Peritoneal incision was extended longitudinally. The utero-vesical peritoneal reflection was incised transversely and the bladder flap was bluntly freed from the lower uterine segment. A low transverse uterine incision was made. Delivered from cephalic presentation was a viable female infant. The placenta was removed Intact and appeared normal. The uterine outline, tubes and ovaries appeared normal}. The uterine incision was closed with running locked sutures of 0chromic gut.   Hemostasis was observed. Lavage was carried out until clear. The fascia was then reapproximated with running sutures of 0PDS. The skin was closed with 4-0Vicryl.   Instrument, sponge, and needle counts were correct prior the abdominal closure and were correct at the conclusion of the case.    Findings:   Estimated Blood Loss: 724 mL   Urine Output: clear  Specimens: placenta for  disposal   Complications: no complications  Disposition: PACU - hemodynamically stable.   Maternal Condition: stable   Baby condition / location:  Couplet care / Skin to Skin  Attending Attestation: I was present and scrubbed for the entire procedure.   Signed: Surgeon(s): Zelphia CairoAdkins, Zamora Colton, MD

## 2017-11-12 NOTE — Anesthesia Procedure Notes (Signed)
Spinal  Patient location during procedure: OR Start time: 11/12/2017 7:30 AM End time: 11/12/2017 7:32 AM Staffing Anesthesiologist: Effie Berkshire, MD Performed: anesthesiologist  Preanesthetic Checklist Completed: patient identified, site marked, surgical consent, pre-op evaluation, timeout performed, IV checked, risks and benefits discussed and monitors and equipment checked Spinal Block Patient position: sitting Prep: Betadine Patient monitoring: heart rate, continuous pulse ox, blood pressure and cardiac monitor Approach: midline Location: L4-5 Injection technique: single-shot Needle Needle type: Introducer and Pencan  Needle gauge: 24 G Needle length: 9 cm Additional Notes Negative paresthesia. Negative blood return. Positive free-flowing CSF. Expiration date of kit checked and confirmed. Patient tolerated procedure well, without complications.

## 2017-11-13 LAB — CBC
HCT: 29 % — ABNORMAL LOW (ref 36.0–46.0)
Hemoglobin: 10.1 g/dL — ABNORMAL LOW (ref 12.0–15.0)
MCH: 32.5 pg (ref 26.0–34.0)
MCHC: 34.8 g/dL (ref 30.0–36.0)
MCV: 93.2 fL (ref 78.0–100.0)
PLATELETS: 214 10*3/uL (ref 150–400)
RBC: 3.11 MIL/uL — ABNORMAL LOW (ref 3.87–5.11)
RDW: 13.6 % (ref 11.5–15.5)
WBC: 14.6 10*3/uL — ABNORMAL HIGH (ref 4.0–10.5)

## 2017-11-13 LAB — BIRTH TISSUE RECOVERY COLLECTION (PLACENTA DONATION)

## 2017-11-13 NOTE — Progress Notes (Signed)
Subjective: Postpartum Day 1: Cesarean Delivery Patient reports tolerating PO.    Objective: Vital signs in last 24 hours: Temp:  [97.5 F (36.4 C)-98.4 F (36.9 C)] 98 F (36.7 C) (03/22 0530) Pulse Rate:  [58-102] 67 (03/22 0530) Resp:  [12-19] 18 (03/22 0530) BP: (85-117)/(50-68) 99/55 (03/22 0530) SpO2:  [95 %-99 %] 98 % (03/22 0530)  Physical Exam:  General: alert Lochia: appropriate Uterine Fundus: firm Incision: healing well DVT Evaluation: No evidence of DVT seen on physical exam.  Recent Labs    11/11/17 1120 11/13/17 0632  HGB 12.8 PENDING  HCT 36.2 29.0*    Assessment/Plan: Status post Cesarean section. Doing well postoperatively.  Continue current care.  Landy Dunnavant Milana ObeyM Keitra Carusone 11/13/2017, 7:06 AM

## 2017-11-13 NOTE — Lactation Note (Signed)
This note was copied from a baby's chart. Lactation Consultation Note  Patient Name: Boy Noel GeroldMaridee Cornman WUJWJ'XToday's Date: 11/13/2017 Reason for consult: (S) Follow-up assessment;Term;Other (Comment)(pos. DAT)    P2 mom with 12 months BF exp. With now 35 year old.  Infant latching well per mom.  Mom states she can hear swallows.  Infant had been at the breast for 20 minutes.  LC burped infant and reviewed hand expression with mom.  Snappie for colostrum collection provided and mom hand expressed 3 ml.  Spoon fed infant and also reviewed feeding any EBM back to infant after feed with curved tip syringe; attempted demonstration but infant would not suck due to sleepiness.  LC encouraged mom to call out for assistance when giving back with curved tip syringe.  LC encouraged mom to hand express prior to latching and then after the bf to collect when hand expressing in order to feed back.  Mom was very excited to see a good amount of colostrum easily expressed.  Reviewed importance of colostrum as a laxative and helping infant decreased bilirubin by stooling.  Baby is pos. DAT.  Infant latched again with lips flanged and good rhythmic sucking noted.  Swallows heard with compressions.  Mom tried football hold which previously was uncomfortable but with support pillows stated she felt better nursing in this hold.  LC encouraged this or cross cradle in order to achieve a deeper latch.  Mom knows to call out for further questions.    BF basics reviewed with mom.      Maternal Data    Feeding Feeding Type: Breast Fed Length of feed: 20 min  LATCH Score                   Interventions    Lactation Tools Discussed/Used     Consult Status      Maryruth HancockKelly Suzanne [facility] 11/13/2017, 11:06 AM

## 2017-11-14 MED ORDER — OXYCODONE HCL 5 MG PO TABS
10.0000 mg | ORAL_TABLET | Freq: Once | ORAL | Status: AC
  Administered 2017-11-14: 10 mg via ORAL
  Filled 2017-11-14: qty 2

## 2017-11-14 NOTE — Progress Notes (Signed)
Subjective: Postpartum Day 2: Cesarean Delivery Patient reports tolerating PO.    Objective: Vital signs in last 24 hours: Temp:  [97.7 F (36.5 C)-97.8 F (36.6 C)] 97.8 F (36.6 C) (03/23 0626) Pulse Rate:  [69-71] 71 (03/23 0626) Resp:  [18-19] 18 (03/23 0626) BP: (109-113)/(60-62) 109/60 (03/23 0626) SpO2:  [98 %] 98 % (03/23 0626)  Physical Exam:  General: alert Lochia: appropriate Uterine Fundus: firm Incision: healing well DVT Evaluation: No evidence of DVT seen on physical exam.  Recent Labs    11/11/17 1120 11/13/17 0632  HGB 12.8 10.1*  HCT 36.2 29.0*    Assessment/Plan: Status post Cesarean section. Doing well postoperatively.  Continue current care Plan D/C in am.  Meriel PicaRichard M Elverda Wendel 11/14/2017, 8:02 AM

## 2017-11-15 ENCOUNTER — Other Ambulatory Visit: Payer: Self-pay

## 2017-11-15 MED ORDER — IBUPROFEN 600 MG PO TABS: 600.0000 mg | ORAL_TABLET | Freq: Four times a day (QID) | ORAL | 0 refills | Status: DC

## 2017-11-15 MED ORDER — OXYCODONE-ACETAMINOPHEN 5-325 MG PO TABS: 2.0000 | ORAL_TABLET | ORAL | 0 refills | Status: DC | PRN

## 2017-11-15 NOTE — Discharge Summary (Signed)
Obstetric Discharge Summary Reason for Admission: cesarean section Prenatal Procedures: none Intrapartum Procedures: cesarean: low cervical, transverse Postpartum Procedures: none Complications-Operative and Postpartum: none Hemoglobin  Date Value Ref Range Status  11/13/2017 10.1 (L) 12.0 - 15.0 g/dL Final    Comment:    REPEATED TO VERIFY DELTA CHECK NOTED    HCT  Date Value Ref Range Status  11/13/2017 29.0 (L) 36.0 - 46.0 % Final    Physical Exam:  General: alert Lochia: appropriate Uterine Fundus: firm Incision: healing well DVT Evaluation: No evidence of DVT seen on physical exam.  Discharge Diagnoses: Term Pregnancy-delivered  Discharge Information: Date: 11/15/2017 Activity: pelvic rest Diet: routine Medications: PNV, Ibuprofen and Percocet Condition: stable Instructions: refer to practice specific booklet Discharge to: home Follow-up Information    [place] Schedule an appointment as soon as possible for a visit in 1 week(s).   Contact information: [address] [place] [address] [phone]           Newborn Data: Live born female  Birth Weight: 8 lb 1.1 oz (3660 g) APGAR: 8, 9  Newborn Delivery   Birth date/time:  11/12/2017 07:57:00 Delivery type:  C-Section, Low Transverse C-section categorization:  Repeat     Home with mother.  Mary Frye 11/15/2017, 8:14 AM

## 2017-11-15 NOTE — Progress Notes (Signed)
Discharge instructions and prescriptions given to pt. Discussed post-op care, when to contact the MD, breast care, and med schedule. Pt verbalized understanding and has no questions or concerns at this time.

## 2018-02-09 DIAGNOSIS — R5383 Other fatigue: Secondary | ICD-10-CM | POA: Diagnosis not present

## 2018-02-09 DIAGNOSIS — N926 Irregular menstruation, unspecified: Secondary | ICD-10-CM | POA: Diagnosis not present

## 2018-02-09 DIAGNOSIS — Z30431 Encounter for routine checking of intrauterine contraceptive device: Secondary | ICD-10-CM | POA: Diagnosis not present

## 2018-06-08 DIAGNOSIS — F419 Anxiety disorder, unspecified: Secondary | ICD-10-CM | POA: Diagnosis not present

## 2018-06-08 DIAGNOSIS — Z3009 Encounter for other general counseling and advice on contraception: Secondary | ICD-10-CM | POA: Diagnosis not present

## 2018-06-08 DIAGNOSIS — F329 Major depressive disorder, single episode, unspecified: Secondary | ICD-10-CM | POA: Diagnosis not present

## 2018-06-08 DIAGNOSIS — Z30432 Encounter for removal of intrauterine contraceptive device: Secondary | ICD-10-CM | POA: Diagnosis not present

## 2018-07-06 DIAGNOSIS — Z3009 Encounter for other general counseling and advice on contraception: Secondary | ICD-10-CM | POA: Diagnosis not present

## 2018-07-09 DIAGNOSIS — M76822 Posterior tibial tendinitis, left leg: Secondary | ICD-10-CM | POA: Diagnosis not present

## 2018-07-09 DIAGNOSIS — M7752 Other enthesopathy of left foot: Secondary | ICD-10-CM | POA: Diagnosis not present

## 2018-07-09 DIAGNOSIS — M19072 Primary osteoarthritis, left ankle and foot: Secondary | ICD-10-CM | POA: Diagnosis not present

## 2018-07-09 DIAGNOSIS — G5762 Lesion of plantar nerve, left lower limb: Secondary | ICD-10-CM | POA: Diagnosis not present

## 2018-07-15 DIAGNOSIS — M76822 Posterior tibial tendinitis, left leg: Secondary | ICD-10-CM | POA: Diagnosis not present

## 2018-07-15 DIAGNOSIS — M71572 Other bursitis, not elsewhere classified, left ankle and foot: Secondary | ICD-10-CM | POA: Diagnosis not present

## 2018-07-20 DIAGNOSIS — M545 Low back pain: Secondary | ICD-10-CM | POA: Diagnosis not present

## 2018-07-20 DIAGNOSIS — M9901 Segmental and somatic dysfunction of cervical region: Secondary | ICD-10-CM | POA: Diagnosis not present

## 2018-07-20 DIAGNOSIS — M546 Pain in thoracic spine: Secondary | ICD-10-CM | POA: Diagnosis not present

## 2018-07-20 DIAGNOSIS — M542 Cervicalgia: Secondary | ICD-10-CM | POA: Diagnosis not present

## 2018-08-02 ENCOUNTER — Encounter: Payer: Self-pay | Admitting: Podiatry

## 2018-08-02 ENCOUNTER — Ambulatory Visit (INDEPENDENT_AMBULATORY_CARE_PROVIDER_SITE_OTHER): Payer: BLUE CROSS/BLUE SHIELD | Admitting: Podiatry

## 2018-08-02 ENCOUNTER — Other Ambulatory Visit: Payer: Self-pay | Admitting: Podiatry

## 2018-08-02 ENCOUNTER — Ambulatory Visit (INDEPENDENT_AMBULATORY_CARE_PROVIDER_SITE_OTHER): Payer: BLUE CROSS/BLUE SHIELD

## 2018-08-02 VITALS — BP 119/82 | HR 84

## 2018-08-02 DIAGNOSIS — G5762 Lesion of plantar nerve, left lower limb: Secondary | ICD-10-CM

## 2018-08-02 DIAGNOSIS — M79672 Pain in left foot: Secondary | ICD-10-CM

## 2018-08-02 NOTE — Patient Instructions (Signed)
Pre-Operative Instructions  Congratulations, you have decided to take an important step towards improving your quality of life.  You can be assured that the doctors and staff at [facility] will be with you every step of the way.  Here are some important things you should know:  1. Plan to be at the surgery center/hospital at least 1 (one) hour prior to your scheduled time, unless otherwise directed by the surgical center/hospital staff.  You must have a responsible adult accompany you, remain during the surgery and drive you home.  Make sure you have directions to the surgical center/hospital to ensure you arrive on time. 2. If you are having surgery at [facility] or  hospitals, you will need a copy of your medical history and physical form from your family physician within one month prior to the date of surgery. We will give you a form for your primary physician to complete.  3. We make every effort to accommodate the date you request for surgery.  However, there are times where surgery dates or times have to be moved.  We will contact you as soon as possible if a change in schedule is required.   4. No aspirin/ibuprofen for one week before surgery.  If you are on aspirin, any non-steroidal anti-inflammatory medications (Mobic, Aleve, Ibuprofen) should not be taken seven (7) days prior to your surgery.  You make take Tylenol for pain prior to surgery.  5. Medications - If you are taking daily heart and blood pressure medications, seizure, reflux, allergy, asthma, anxiety, pain or diabetes medications, make sure you notify the surgery center/hospital before the day of surgery so they can tell you which medications you should take or avoid the day of surgery. 6. No food or drink after midnight the night before surgery unless directed otherwise by surgical center/hospital staff. 7. No alcoholic beverages 24-hours prior to surgery.  No smoking 24-hours prior or 24-hours after  surgery. 8. Wear loose pants or shorts. They should be loose enough to fit over bandages, boots, and casts. 9. Don't wear slip-on shoes. Sneakers are preferred. 10. Bring your boot with you to the surgery center/hospital.  Also bring crutches or a walker if your physician has prescribed it for you.  If you do not have this equipment, it will be provided for you after surgery. 11. If you have not been contacted by the surgery center/hospital by the day before your surgery, call to confirm the date and time of your surgery. 12. Leave-time from work may vary depending on the type of surgery you have.  Appropriate arrangements should be made prior to surgery with your employer. 13. Prescriptions will be provided immediately following surgery by your doctor.  Fill these as soon as possible after surgery and take the medication as directed. Pain medications will not be refilled on weekends and must be approved by the doctor. 14. Remove nail polish on the operative foot and avoid getting pedicures prior to surgery. 15. Wash the night before surgery.  The night before surgery wash the foot and leg well with water and the antibacterial soap provided. Be sure to pay special attention to beneath the toenails and in between the toes.  Wash for at least three (3) minutes. Rinse thoroughly with water and dry well with a towel.  Perform this wash unless told not to do so by your physician.  Enclosed: 1 Ice pack (please put in freezer the night before surgery)   1 Hibiclens skin cleaner     Pre-op instructions  If you have any questions regarding the instructions, please do not hesitate to call our office.  [place]: [address], [place], [place] [address] -- [phone]  [place]: [address] [place], [place] [address] -- [phone]  [place]: [address]  [place], [place] [address] -- [phone]  [place]: [address], [place], [place] [address] -- [phone]  Website:  [email or web address] 

## 2018-08-03 NOTE — Progress Notes (Signed)
   HPI: 37 year old female presenting today as a new patient with a chief complaint of sharp, stabbing pain in the ball of the left foot that began a few years ago. She states she received an injection from Dr. Elijah Birkom one month ago but had a bad reaction to it. Walking increases the pain. She has been taking Duexis prescribed by Dr. Elijah Birkom. Patient is here for further evaluation and treatment.   Past Medical History:  Diagnosis Date  . History of back injury    horse injury trt with physical therapy  . History of concussion    years ago horse injury  . Hx of varicella   . Supraumbilical hernia    small  . Vaginal Pap smear, abnormal      Physical Exam: General: The patient is alert and oriented x3 in no acute distress.  Dermatology: Skin is warm, dry and supple bilateral lower extremities. Negative for open lesions or macerations.  Vascular: Palpable pedal pulses bilaterally. No edema or erythema noted. Capillary refill within normal limits.  Neurological: Epicritic and protective threshold grossly intact bilaterally.   Musculoskeletal Exam: Sharp pain with palpation of the 2nd interspace and lateral compression of the metatarsal heads consistent with neuroma.  Positive Lendell CapriceSullivan sign with loadbearing of the forefoot.  Radiographic Exam:  Normal osseous mineralization. Joint spaces preserved. No fracture/dislocation/boney destruction.    Assessment: 1.  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 Patient was evaluated. X-Rays reviewed.  2. Today we discussed the conservative versus surgical management of the presenting pathology. The patient opts for surgical management. All possible complications and details of the procedure were explained. All patient questions were answered. No guarantees were expressed or implied. 3. Authorization for surgery was initiated today. Surgery will consist of excision of Morton's neuroma left 2nd interspace.  4. Patient is having custom  orthotics made at Dr. Tasia Catchingsom's office.  5. Continue taking Duexis prescribed by Dr. Tasia Catchingsom's office.  6. Return to clinic one week post op.   [place]. [facility] at [facility].    Felecia ShellingBrent M. Evans, DPM [facility]  Dr. Felecia ShellingBrent M. Evans, DPM    [address]                                        [place], [place] [address]                Office [phone]  Fax [phone]

## 2018-08-10 DIAGNOSIS — M545 Low back pain: Secondary | ICD-10-CM | POA: Diagnosis not present

## 2018-08-10 DIAGNOSIS — M546 Pain in thoracic spine: Secondary | ICD-10-CM | POA: Diagnosis not present

## 2018-08-10 DIAGNOSIS — M9901 Segmental and somatic dysfunction of cervical region: Secondary | ICD-10-CM | POA: Diagnosis not present

## 2018-08-10 DIAGNOSIS — M542 Cervicalgia: Secondary | ICD-10-CM | POA: Diagnosis not present

## 2018-08-12 ENCOUNTER — Telehealth: Payer: Self-pay | Admitting: *Deleted

## 2018-08-12 NOTE — Telephone Encounter (Signed)
"  Good morning.  I'm scheduled for surgery in one week with Dr. Logan BoresEvans.  I have a few questions about the anesthesia and weight bearing status post surgery.  If you could, please give me a call back when you get a chance.  I'm also wondering if guys had an idea about what time that might be."

## 2018-08-13 DIAGNOSIS — M542 Cervicalgia: Secondary | ICD-10-CM | POA: Diagnosis not present

## 2018-08-13 DIAGNOSIS — M419 Scoliosis, unspecified: Secondary | ICD-10-CM | POA: Diagnosis not present

## 2018-08-13 NOTE — Telephone Encounter (Signed)
I am returning your call.  You have questions.  "Yes, what type of anesthesia will I be receiving?"  You will get general anesthetic.  "I guess I'm confused about the location.  [address] or is [address]?"  [facility] and they are located at [address]  The address is on the back of that brochure that we gave you.  "What time will my surgery be, will it be in the morning?"  I can't give you a time but it looks like it will be done in the morning hours.  Someone from the surgical center will give you a call a day or two prior to your surgery date and they will give you your arrival time.  "Okay thank you, I think you answered all my questions."

## 2018-08-19 ENCOUNTER — Other Ambulatory Visit: Payer: Self-pay | Admitting: Podiatry

## 2018-08-19 DIAGNOSIS — G5762 Lesion of plantar nerve, left lower limb: Secondary | ICD-10-CM | POA: Diagnosis not present

## 2018-08-19 DIAGNOSIS — Z01818 Encounter for other preprocedural examination: Secondary | ICD-10-CM | POA: Diagnosis not present

## 2018-08-19 MED ORDER — IBUPROFEN 800 MG PO TABS: 800.0000 mg | ORAL_TABLET | Freq: Three times a day (TID) | ORAL | 0 refills | Status: DC | PRN

## 2018-08-19 MED ORDER — OXYCODONE-ACETAMINOPHEN 5-325 MG PO TABS: 1.0000 | ORAL_TABLET | Freq: Four times a day (QID) | ORAL | 0 refills | Status: DC | PRN

## 2018-08-19 NOTE — Progress Notes (Signed)
Post op

## 2018-08-24 ENCOUNTER — Encounter: Payer: Self-pay | Admitting: Podiatry

## 2018-08-26 ENCOUNTER — Ambulatory Visit (INDEPENDENT_AMBULATORY_CARE_PROVIDER_SITE_OTHER): Payer: BLUE CROSS/BLUE SHIELD | Admitting: Podiatry

## 2018-08-26 VITALS — Temp 97.4°F

## 2018-08-26 DIAGNOSIS — Z9889 Other specified postprocedural states: Secondary | ICD-10-CM

## 2018-08-26 DIAGNOSIS — G5762 Lesion of plantar nerve, left lower limb: Secondary | ICD-10-CM

## 2018-08-30 ENCOUNTER — Telehealth: Payer: Self-pay | Admitting: Podiatry

## 2018-08-30 ENCOUNTER — Other Ambulatory Visit: Payer: Self-pay | Admitting: Podiatry

## 2018-08-30 MED ORDER — OXYCODONE-ACETAMINOPHEN 5-325 MG PO TABS: 1.0000 | ORAL_TABLET | Freq: Four times a day (QID) | ORAL | 0 refills | Status: DC | PRN

## 2018-08-30 NOTE — Progress Notes (Signed)
Post op pain 

## 2018-08-30 NOTE — Telephone Encounter (Signed)
I'm calling to see if I can get a refill on my pain medication. I can be reached at [phone].

## 2018-08-30 NOTE — Progress Notes (Signed)
Subjective: Mary Frye is a 38 y.o. is seen today in office s/p left foot neurectomy preformed on 08/19/2018 with Dr. Logan Bores. They state their pain is controlled.  She had some nausea after surgery but overall this has improved and she is felt fine since.  She states that she is still taking pain medicine twice a day plus ibuprofen. Denies any systemic complaints such as fevers, chills, nausea, vomiting. No calf pain, chest pain, shortness of breath.   Objective: General: No acute distress, AAOx3  DP/PT pulses palpable 2/4, CRT < 3 sec to all digits.  Protective sensation intact. Motor function intact.  LEFT foot: Incision is well coapted without any evidence of dehiscence and sutures are intact. There is no surrounding erythema, ascending cellulitis, fluctuance, crepitus, malodor, drainage/purulence. There is minimal edema around the surgical site. There is no pain along the surgical site.  No other areas of tenderness to bilateral lower extremities.  No other open lesions or pre-ulcerative lesions.  No pain with calf compression, swelling, warmth, erythema.   Assessment and Plan:  Status post left foot surgery, doing well with no complications   -Treatment options discussed including all alternatives, risks, and complications -Incisions healing well.  Antibiotic ointment and a bandage was applied.  Keep dressing clean, dry, intact. -Remain in surgical shoe. -Ice/elevation -Pain medication as needed-refilled ibuprofen today. -Monitor for any clinical signs or symptoms of infection and DVT/PE and directed to call the office immediately should any occur or go to the ER. -Follow-up as scheduled with Dr. Logan Bores or sooner if any problems arise. In the meantime, encouraged to call the office with any questions, concerns, change in symptoms.   Ovid Curd, DPM

## 2018-08-30 NOTE — Telephone Encounter (Signed)
Sent!

## 2018-09-01 DIAGNOSIS — M9901 Segmental and somatic dysfunction of cervical region: Secondary | ICD-10-CM | POA: Diagnosis not present

## 2018-09-01 DIAGNOSIS — M546 Pain in thoracic spine: Secondary | ICD-10-CM | POA: Diagnosis not present

## 2018-09-01 DIAGNOSIS — M545 Low back pain: Secondary | ICD-10-CM | POA: Diagnosis not present

## 2018-09-01 DIAGNOSIS — M542 Cervicalgia: Secondary | ICD-10-CM | POA: Diagnosis not present

## 2018-09-06 ENCOUNTER — Ambulatory Visit (INDEPENDENT_AMBULATORY_CARE_PROVIDER_SITE_OTHER): Payer: Self-pay | Admitting: Podiatry

## 2018-09-06 DIAGNOSIS — G5762 Lesion of plantar nerve, left lower limb: Secondary | ICD-10-CM

## 2018-09-06 DIAGNOSIS — Z9889 Other specified postprocedural states: Secondary | ICD-10-CM

## 2018-09-15 NOTE — Progress Notes (Signed)
   Subjective:  Patient presents today status post excision of Morton's neuroma left. DOS: 08/19/2018.  Patient has no complaints.  She says that her foot feels fine.  She has been weightbearing in the postoperative shoe without any complications.  She is no longer taking pain medication.  Past Medical History:  Diagnosis Date  . History of back injury    horse injury trt with physical therapy  . History of concussion    years ago horse injury  . Hx of varicella   . Supraumbilical hernia    small  . Vaginal Pap smear, abnormal       Objective/Physical Exam Neurovascular status intact.  Skin incisions appear to be well coapted with sutures and staples intact. No sign of infectious process noted. No dehiscence. No active bleeding noted. Moderate edema noted to the surgical extremity.  Assessment: 1. s/p excision of Morton's neuroma left foot. DOS: 08/19/2018   Plan of Care:  1. Patient was evaluated.  2.  Patient can begin to transition to good supportive shoe gear. 3.  Sutures were removed today.  Dry sterile dressing applied. 4.  Return to clinic in 4 weeks   Felecia Shelling, DPM [facility]  Dr. Felecia Shelling, DPM    [address]                                        [place], [place] [address]                Office [phone]  Fax [phone]

## 2018-09-21 ENCOUNTER — Telehealth: Payer: Self-pay | Admitting: Podiatry

## 2018-09-21 NOTE — Telephone Encounter (Signed)
Pt states she had at 3 weeks been doing good, and really pushed it and now has swelling and pain, and has gone back into her exercising to an extent and pushing her children in the stroller. I told pt it was normal to have some swelling, but if she was having swelling and pain without redness, drainage or open wound, she may have started too much activity too soon. I told pt to go back into the surgery shoe to allow the area to rest and on the 4th day begin wearing the athletic shoes and once she had discomfort to go back in to the surgery shoe and not to add the exercise until she was evaluated at her next appt. Pt states she is in a high impact exercise class and the instructor has made accommodations for her. Pt states she has also performed scar tissue massage and has a feeling of a ball on the bottom of her feet. Pt asked if that was a numbness or swelling. I told pt it could be swelling or how her body perceived the numbness. Pt asked if she should continue the ibuprofen and it she could take percocet 1/2 tablet at noon. I told pt to take the ibuprofen as directed and take the percocet at bedtime.

## 2018-09-21 NOTE — Telephone Encounter (Signed)
Pt had surgery 08/19/2018 and she had some questions. Please give her a call.

## 2018-09-22 DIAGNOSIS — M545 Low back pain: Secondary | ICD-10-CM | POA: Diagnosis not present

## 2018-09-22 DIAGNOSIS — M546 Pain in thoracic spine: Secondary | ICD-10-CM | POA: Diagnosis not present

## 2018-09-22 DIAGNOSIS — M542 Cervicalgia: Secondary | ICD-10-CM | POA: Diagnosis not present

## 2018-09-22 DIAGNOSIS — M9901 Segmental and somatic dysfunction of cervical region: Secondary | ICD-10-CM | POA: Diagnosis not present

## 2018-09-29 DIAGNOSIS — M546 Pain in thoracic spine: Secondary | ICD-10-CM | POA: Diagnosis not present

## 2018-09-29 DIAGNOSIS — M9903 Segmental and somatic dysfunction of lumbar region: Secondary | ICD-10-CM | POA: Diagnosis not present

## 2018-09-29 DIAGNOSIS — M545 Low back pain: Secondary | ICD-10-CM | POA: Diagnosis not present

## 2018-10-04 ENCOUNTER — Ambulatory Visit (INDEPENDENT_AMBULATORY_CARE_PROVIDER_SITE_OTHER): Payer: BLUE CROSS/BLUE SHIELD | Admitting: Podiatry

## 2018-10-04 ENCOUNTER — Encounter: Payer: Self-pay | Admitting: Podiatry

## 2018-10-04 DIAGNOSIS — Z9889 Other specified postprocedural states: Secondary | ICD-10-CM

## 2018-10-04 DIAGNOSIS — G5762 Lesion of plantar nerve, left lower limb: Secondary | ICD-10-CM

## 2018-10-04 MED ORDER — MELOXICAM 15 MG PO TABS: 15.0000 mg | ORAL_TABLET | Freq: Every day | ORAL | 1 refills | Status: DC

## 2018-10-08 NOTE — Progress Notes (Signed)
   Subjective:  Patient presents today status post excision of Morton's neuroma left. DOS: 08/19/2018. She states she is doing well overall. She reports some soreness of the area with associated numbness and swelling of the feet bilaterally. She has been wearing good shoes which help alleviate her symptoms. There are no modifying factors noted. Patient is here for further evaluation and treatment.   Past Medical History:  Diagnosis Date  . History of back injury    horse injury trt with physical therapy  . History of concussion    years ago horse injury  . Hx of varicella   . Supraumbilical hernia    small  . Vaginal Pap smear, abnormal       Objective/Physical Exam Neurovascular status intact.  Skin incisions appear to be well coapted. No sign of infectious process noted. No dehiscence. No active bleeding noted. Moderate edema noted to the surgical extremity.  Assessment: 1. s/p excision of Morton's neuroma left foot. DOS: 08/19/2018   Plan of Care:  1. Patient was evaluated.  2. Continue wearing Brooks shoes. 3. Prescription for Meloxicam provided to patient. 4. May resume full activity with no restrictions.  5. Return to clinic as needed.    Felecia Shelling, DPM [facility]  Dr. Felecia Shelling, DPM    [address]                                        [place], [place] [address]                Office [phone]  Fax [phone]

## 2018-10-13 DIAGNOSIS — F900 Attention-deficit hyperactivity disorder, predominantly inattentive type: Secondary | ICD-10-CM | POA: Diagnosis not present

## 2018-10-27 DIAGNOSIS — M542 Cervicalgia: Secondary | ICD-10-CM | POA: Diagnosis not present

## 2018-10-27 DIAGNOSIS — M9903 Segmental and somatic dysfunction of lumbar region: Secondary | ICD-10-CM | POA: Diagnosis not present

## 2018-10-27 DIAGNOSIS — M546 Pain in thoracic spine: Secondary | ICD-10-CM | POA: Diagnosis not present

## 2018-10-27 DIAGNOSIS — M545 Low back pain: Secondary | ICD-10-CM | POA: Diagnosis not present

## 2018-11-03 ENCOUNTER — Encounter: Payer: Self-pay | Admitting: Podiatry

## 2018-11-10 DIAGNOSIS — F900 Attention-deficit hyperactivity disorder, predominantly inattentive type: Secondary | ICD-10-CM | POA: Diagnosis not present

## 2018-11-25 DIAGNOSIS — M546 Pain in thoracic spine: Secondary | ICD-10-CM | POA: Diagnosis not present

## 2018-11-25 DIAGNOSIS — M9903 Segmental and somatic dysfunction of lumbar region: Secondary | ICD-10-CM | POA: Diagnosis not present

## 2018-11-25 DIAGNOSIS — M542 Cervicalgia: Secondary | ICD-10-CM | POA: Diagnosis not present

## 2018-11-25 DIAGNOSIS — M545 Low back pain: Secondary | ICD-10-CM | POA: Diagnosis not present

## 2019-01-12 DIAGNOSIS — M9901 Segmental and somatic dysfunction of cervical region: Secondary | ICD-10-CM | POA: Diagnosis not present

## 2019-01-12 DIAGNOSIS — M546 Pain in thoracic spine: Secondary | ICD-10-CM | POA: Diagnosis not present

## 2019-01-12 DIAGNOSIS — M545 Low back pain: Secondary | ICD-10-CM | POA: Diagnosis not present

## 2019-01-12 DIAGNOSIS — M542 Cervicalgia: Secondary | ICD-10-CM | POA: Diagnosis not present

## 2019-01-31 DIAGNOSIS — Z01419 Encounter for gynecological examination (general) (routine) without abnormal findings: Secondary | ICD-10-CM | POA: Diagnosis not present

## 2019-01-31 DIAGNOSIS — Z6824 Body mass index (BMI) 24.0-24.9, adult: Secondary | ICD-10-CM | POA: Diagnosis not present

## 2019-01-31 DIAGNOSIS — N939 Abnormal uterine and vaginal bleeding, unspecified: Secondary | ICD-10-CM | POA: Diagnosis not present

## 2019-01-31 DIAGNOSIS — Z113 Encounter for screening for infections with a predominantly sexual mode of transmission: Secondary | ICD-10-CM | POA: Diagnosis not present

## 2019-02-01 ENCOUNTER — Other Ambulatory Visit: Payer: Self-pay | Admitting: Podiatry

## 2019-02-01 DIAGNOSIS — M542 Cervicalgia: Secondary | ICD-10-CM | POA: Diagnosis not present

## 2019-02-01 DIAGNOSIS — M545 Low back pain: Secondary | ICD-10-CM | POA: Diagnosis not present

## 2019-02-01 DIAGNOSIS — M546 Pain in thoracic spine: Secondary | ICD-10-CM | POA: Diagnosis not present

## 2019-02-01 DIAGNOSIS — M9901 Segmental and somatic dysfunction of cervical region: Secondary | ICD-10-CM | POA: Diagnosis not present

## 2019-02-23 DIAGNOSIS — M9901 Segmental and somatic dysfunction of cervical region: Secondary | ICD-10-CM | POA: Diagnosis not present

## 2019-02-23 DIAGNOSIS — M545 Low back pain: Secondary | ICD-10-CM | POA: Diagnosis not present

## 2019-02-23 DIAGNOSIS — M546 Pain in thoracic spine: Secondary | ICD-10-CM | POA: Diagnosis not present

## 2019-02-23 DIAGNOSIS — M542 Cervicalgia: Secondary | ICD-10-CM | POA: Diagnosis not present

## 2019-02-24 DIAGNOSIS — J029 Acute pharyngitis, unspecified: Secondary | ICD-10-CM | POA: Diagnosis not present

## 2019-02-24 DIAGNOSIS — R52 Pain, unspecified: Secondary | ICD-10-CM | POA: Diagnosis not present

## 2019-03-21 DIAGNOSIS — M542 Cervicalgia: Secondary | ICD-10-CM | POA: Diagnosis not present

## 2019-03-21 DIAGNOSIS — M545 Low back pain: Secondary | ICD-10-CM | POA: Diagnosis not present

## 2019-03-21 DIAGNOSIS — M546 Pain in thoracic spine: Secondary | ICD-10-CM | POA: Diagnosis not present

## 2019-03-21 DIAGNOSIS — M9901 Segmental and somatic dysfunction of cervical region: Secondary | ICD-10-CM | POA: Diagnosis not present

## 2019-03-27 ENCOUNTER — Other Ambulatory Visit: Payer: Self-pay | Admitting: Podiatry

## 2019-04-04 DIAGNOSIS — M542 Cervicalgia: Secondary | ICD-10-CM | POA: Diagnosis not present

## 2019-04-04 DIAGNOSIS — M545 Low back pain: Secondary | ICD-10-CM | POA: Diagnosis not present

## 2019-04-04 DIAGNOSIS — M546 Pain in thoracic spine: Secondary | ICD-10-CM | POA: Diagnosis not present

## 2019-04-04 DIAGNOSIS — M9901 Segmental and somatic dysfunction of cervical region: Secondary | ICD-10-CM | POA: Diagnosis not present

## 2019-04-28 DIAGNOSIS — M545 Low back pain: Secondary | ICD-10-CM | POA: Diagnosis not present

## 2019-04-28 DIAGNOSIS — M546 Pain in thoracic spine: Secondary | ICD-10-CM | POA: Diagnosis not present

## 2019-04-28 DIAGNOSIS — M9901 Segmental and somatic dysfunction of cervical region: Secondary | ICD-10-CM | POA: Diagnosis not present

## 2019-04-28 DIAGNOSIS — M542 Cervicalgia: Secondary | ICD-10-CM | POA: Diagnosis not present

## 2019-05-13 DIAGNOSIS — Z0000 Encounter for general adult medical examination without abnormal findings: Secondary | ICD-10-CM | POA: Diagnosis not present

## 2019-05-13 DIAGNOSIS — F900 Attention-deficit hyperactivity disorder, predominantly inattentive type: Secondary | ICD-10-CM | POA: Diagnosis not present

## 2019-05-23 DIAGNOSIS — M542 Cervicalgia: Secondary | ICD-10-CM | POA: Diagnosis not present

## 2019-05-23 DIAGNOSIS — M546 Pain in thoracic spine: Secondary | ICD-10-CM | POA: Diagnosis not present

## 2019-05-23 DIAGNOSIS — M545 Low back pain: Secondary | ICD-10-CM | POA: Diagnosis not present

## 2019-05-23 DIAGNOSIS — M9901 Segmental and somatic dysfunction of cervical region: Secondary | ICD-10-CM | POA: Diagnosis not present

## 2019-07-08 DIAGNOSIS — N911 Secondary amenorrhea: Secondary | ICD-10-CM | POA: Diagnosis not present

## 2019-07-15 DIAGNOSIS — Z3481 Encounter for supervision of other normal pregnancy, first trimester: Secondary | ICD-10-CM | POA: Diagnosis not present

## 2019-07-15 DIAGNOSIS — Z3685 Encounter for antenatal screening for Streptococcus B: Secondary | ICD-10-CM | POA: Diagnosis not present

## 2019-07-15 LAB — OB RESULTS CONSOLE ANTIBODY SCREEN: Antibody Screen: NEGATIVE

## 2019-07-15 LAB — OB RESULTS CONSOLE HEPATITIS B SURFACE ANTIGEN: Hepatitis B Surface Ag: NEGATIVE

## 2019-07-15 LAB — OB RESULTS CONSOLE GC/CHLAMYDIA
Chlamydia: NEGATIVE
Gonorrhea: NEGATIVE

## 2019-07-15 LAB — OB RESULTS CONSOLE ABO/RH: RH Type: POSITIVE

## 2019-07-15 LAB — OB RESULTS CONSOLE RPR: RPR: NONREACTIVE

## 2019-07-15 LAB — OB RESULTS CONSOLE HIV ANTIBODY (ROUTINE TESTING): HIV: NONREACTIVE

## 2019-08-01 DIAGNOSIS — Z113 Encounter for screening for infections with a predominantly sexual mode of transmission: Secondary | ICD-10-CM | POA: Diagnosis not present

## 2019-08-01 DIAGNOSIS — Z3400 Encounter for supervision of normal first pregnancy, unspecified trimester: Secondary | ICD-10-CM | POA: Diagnosis not present

## 2019-08-03 LAB — OB RESULTS CONSOLE RUBELLA ANTIBODY, IGM: Rubella: IMMUNE

## 2019-08-16 DIAGNOSIS — Z3682 Encounter for antenatal screening for nuchal translucency: Secondary | ICD-10-CM | POA: Diagnosis not present

## 2019-08-16 DIAGNOSIS — Z139 Encounter for screening, unspecified: Secondary | ICD-10-CM | POA: Diagnosis not present

## 2019-08-16 DIAGNOSIS — Z3A12 12 weeks gestation of pregnancy: Secondary | ICD-10-CM | POA: Diagnosis not present

## 2019-09-26 DIAGNOSIS — Z361 Encounter for antenatal screening for raised alphafetoprotein level: Secondary | ICD-10-CM | POA: Diagnosis not present

## 2019-09-26 DIAGNOSIS — Z3A18 18 weeks gestation of pregnancy: Secondary | ICD-10-CM | POA: Diagnosis not present

## 2019-09-26 DIAGNOSIS — Z3480 Encounter for supervision of other normal pregnancy, unspecified trimester: Secondary | ICD-10-CM | POA: Diagnosis not present

## 2019-09-26 DIAGNOSIS — Z363 Encounter for antenatal screening for malformations: Secondary | ICD-10-CM | POA: Diagnosis not present

## 2019-10-25 DIAGNOSIS — Z3A22 22 weeks gestation of pregnancy: Secondary | ICD-10-CM | POA: Diagnosis not present

## 2019-10-25 DIAGNOSIS — Z362 Encounter for other antenatal screening follow-up: Secondary | ICD-10-CM | POA: Diagnosis not present

## 2019-11-23 DIAGNOSIS — Z23 Encounter for immunization: Secondary | ICD-10-CM | POA: Diagnosis not present

## 2019-11-23 DIAGNOSIS — Z3480 Encounter for supervision of other normal pregnancy, unspecified trimester: Secondary | ICD-10-CM | POA: Diagnosis not present

## 2020-01-24 DIAGNOSIS — Z3685 Encounter for antenatal screening for Streptococcus B: Secondary | ICD-10-CM | POA: Diagnosis not present

## 2020-01-24 LAB — OB RESULTS CONSOLE GBS: GBS: NEGATIVE

## 2020-02-07 ENCOUNTER — Encounter (HOSPITAL_COMMUNITY): Payer: Self-pay | Admitting: *Deleted

## 2020-02-07 NOTE — Patient Instructions (Signed)
Mary Frye  02/07/2020   Your procedure is scheduled on:  02/16/2020  Arrive at 0530 at Entrance C on [address] at [facility]  and [employer]. You are invited to use the FREE valet parking or use the Visitor's parking deck.  Pick up the phone at the desk and dial [phone].  Call this number if you have problems the morning of surgery: [phone]  Remember:   Do not eat food:(After Midnight) Desps de medianoche.  Do not drink clear liquids: (After Midnight) Desps de medianoche.  Take these medicines the morning of surgery with A SIP OF WATER:  none   Do not wear jewelry, make-up or nail polish.  Do not wear lotions, powders, or perfumes. Do not wear deodorant.  Do not shave 48 hours prior to surgery.  Do not bring valuables to the hospital.  [facility] is not   responsible for any belongings or valuables brought to the hospital.  Contacts, dentures or bridgework may not be worn into surgery.  Leave suitcase in the car. After surgery it may be brought to your room.  For patients admitted to the hospital, checkout time is 11:00 AM the day of              discharge.      Please read over the following fact sheets that you were given:     Preparing for Surgery

## 2020-02-08 ENCOUNTER — Encounter (HOSPITAL_COMMUNITY): Payer: Self-pay

## 2020-02-14 ENCOUNTER — Other Ambulatory Visit (HOSPITAL_COMMUNITY)
Admission: RE | Admit: 2020-02-14 | Discharge: 2020-02-14 | Disposition: A | Discharge status: Home / Self Care | Payer: BC Managed Care – PPO | Source: Ambulatory Visit | Attending: Obstetrics and Gynecology | Admitting: Obstetrics and Gynecology

## 2020-02-14 ENCOUNTER — Other Ambulatory Visit: Payer: Self-pay

## 2020-02-14 DIAGNOSIS — Z302 Encounter for sterilization: Secondary | ICD-10-CM | POA: Diagnosis not present

## 2020-02-14 DIAGNOSIS — Z3A39 39 weeks gestation of pregnancy: Secondary | ICD-10-CM | POA: Diagnosis not present

## 2020-02-14 DIAGNOSIS — O34211 Maternal care for low transverse scar from previous cesarean delivery: Secondary | ICD-10-CM | POA: Diagnosis not present

## 2020-02-14 DIAGNOSIS — Z20822 Contact with and (suspected) exposure to covid-19: Secondary | ICD-10-CM | POA: Diagnosis not present

## 2020-02-14 LAB — CBC
HCT: 37.8 % (ref 36.0–46.0)
Hemoglobin: 12.9 g/dL (ref 12.0–15.0)
MCH: 33.1 pg (ref 26.0–34.0)
MCHC: 34.1 g/dL (ref 30.0–36.0)
MCV: 96.9 fL (ref 80.0–100.0)
Platelets: 211 10*3/uL (ref 150–400)
RBC: 3.9 MIL/uL (ref 3.87–5.11)
RDW: 12.9 % (ref 11.5–15.5)
WBC: 8.4 10*3/uL (ref 4.0–10.5)
nRBC: 0 % (ref 0.0–0.2)

## 2020-02-14 LAB — SARS CORONAVIRUS 2 (TAT 6-24 HRS): SARS Coronavirus 2: NEGATIVE

## 2020-02-14 LAB — TYPE AND SCREEN
ABO/RH(D): O POS
Antibody Screen: NEGATIVE

## 2020-02-14 LAB — ABO/RH: ABO/RH(D): O POS

## 2020-02-14 NOTE — Anesthesia Preprocedure Evaluation (Addendum)
Anesthesia Evaluation  Patient identified by MRN, date of birth, ID band Patient awake    Reviewed: Allergy & Precautions, NPO status , Patient's Chart, lab work & pertinent test results  History of Anesthesia Complications Negative for: history of anesthetic complications  Airway Mallampati: II  TM Distance: >3 FB Neck ROM: Full    Dental no notable dental hx.    Pulmonary neg pulmonary ROS,    Pulmonary exam normal breath sounds clear to auscultation       Cardiovascular negative cardio ROS Normal cardiovascular exam Rhythm:Regular Rate:Normal     Neuro/Psych negative neurological ROS  negative psych ROS   GI/Hepatic negative GI ROS, Neg liver ROS,   Endo/Other  negative endocrine ROS  Renal/GU negative Renal ROS  negative genitourinary   Musculoskeletal negative musculoskeletal ROS (+)   Abdominal   Peds  Hematology negative hematology ROS (+)   Anesthesia Other Findings Day of surgery medications reviewed with patient.  Reproductive/Obstetrics (+) Pregnancy (Hx of C/S x2)                            Anesthesia Physical Anesthesia Plan  ASA: III  Anesthesia Plan: Spinal   Post-op Pain Management:    Induction:   PONV Risk Score and Plan: 4 or greater and Treatment may vary due to age or medical condition, Ondansetron and Dexamethasone  Airway Management Planned: Natural Airway  Additional Equipment: None  Intra-op Plan:   Post-operative Plan:   Informed Consent: I have reviewed the patients History and Physical, chart, labs and discussed the procedure including the risks, benefits and alternatives for the proposed anesthesia with the patient or authorized representative who has indicated his/her understanding and acceptance.       Plan Discussed with: CRNA  Anesthesia Plan Comments:        Anesthesia Quick Evaluation

## 2020-02-14 NOTE — Progress Notes (Signed)
Presents for Covid 19 testing & blood work prior to scheduled surgery.  Denies symptoms.  Tolerated testing well.

## 2020-02-14 NOTE — H&P (Signed)
Mary Frye is a 39 y.o. [phone] female at 47 weeks presenting for repeat C/S and BTL.  Patient has had previous C/S x 2.  No antepartum complications.  AMA with low risk NIPS.  GBS negative.  OB History    Gravida  4   Para  2   Term  2   Preterm      AB  1   Living  2     SAB  1   TAB      Ectopic      Multiple  0   Live Births  2          Past Medical History:  Diagnosis Date  . History of back injury    horse injury trt with physical therapy  . History of concussion    years ago horse injury  . Hx of varicella   . Supraumbilical hernia    small  . Vaginal Pap smear, abnormal    Past Surgical History:  Procedure Laterality Date  . CESAREAN SECTION N/A 12/17/2015   Procedure: CESAREAN SECTION;  Surgeon: Harold Hedge, MD;  Location: [facility] ORS;  Service: Obstetrics;  Laterality: N/A;  . CESAREAN SECTION N/A 11/12/2017   Procedure: REPEAT CESAREAN SECTION;  Surgeon: Zelphia Cairo, MD;  Location: [facility] BIRTHING SUIT[facility];  Service: Obstetrics;  Laterality: N/A;  REPEAT EDC 11/18/17 [facility] NEED RNFA  . dental implants     39 yo  . FOOT SURGERY    . LEEP     Family History: family history includes Cancer in her maternal aunt and maternal aunt; Leukemia in her father; Seizures in her maternal aunt. Social History:  reports that she has never smoked. She has never used smokeless tobacco. She reports that she does not drink alcohol and does not use drugs.     Maternal Diabetes: No Genetic Screening: Normal Maternal Ultrasounds/Referrals: Normal Fetal Ultrasounds or other Referrals:  None Maternal Substance Abuse:  No Significant Maternal Medications:  None Significant Maternal Lab Results:  Group B Strep negative Other Comments:  None  Review of Systems Maternal Medical History:  Prenatal complications: no prenatal complications Prenatal Complications - Diabetes: none.      Last menstrual period 05/19/2019, unknown if currently breastfeeding. Maternal  Exam:  Abdomen: Patient reports no abdominal tenderness. Surgical scars: low transverse.   Fundal height is c/w dates.   Estimated fetal weight is 8.       Physical Exam  HENT:  Head: Normocephalic and atraumatic.  Respiratory: Effort normal.  GI: Soft. Normal appearance.  Musculoskeletal:     Cervical back: Normal range of motion.  Neurological: She is alert.  Skin: Skin is warm and dry.  Psychiatric: Her behavior is normal. Mood normal.    Prenatal labs: ABO, Rh: O/Positive/-- (11/20 0000) Antibody: Negative (11/20 0000) Rubella: Immune (12/09 0000) RPR: Nonreactive (11/20 0000)  HBsAg: Negative (11/20 0000)  HIV: Non-reactive (11/20 0000)  GBS: Negative/-- (06/01 0000)   Assessment/Plan: 38yo [id number] at 39 weeks for repeat C/S and BTL Patient has been counseled re: risk of bleeding, infection, scarring, and damage to surrounding structures.  She is informed of risk of failure and regret with BTL.  All questions were answered and patient wishes to proceed.   Mitchel Honour 02/14/2020, 9:01 AM

## 2020-02-15 ENCOUNTER — Encounter (HOSPITAL_COMMUNITY): Payer: Self-pay | Admitting: Obstetrics & Gynecology

## 2020-02-15 LAB — RPR: RPR Ser Ql: NONREACTIVE

## 2020-02-16 ENCOUNTER — Other Ambulatory Visit: Payer: Self-pay

## 2020-02-16 ENCOUNTER — Inpatient Hospital Stay (HOSPITAL_COMMUNITY): Payer: BC Managed Care – PPO | Admitting: Anesthesiology

## 2020-02-16 ENCOUNTER — Inpatient Hospital Stay (HOSPITAL_COMMUNITY)
Admission: RE | Admit: 2020-02-16 | Discharge: 2020-02-19 | DRG: 785 | Disposition: A | Discharge status: Home / Self Care | Payer: BC Managed Care – PPO | Attending: Obstetrics & Gynecology | Admitting: Obstetrics & Gynecology

## 2020-02-16 ENCOUNTER — Encounter (HOSPITAL_COMMUNITY): Payer: Self-pay | Admitting: Obstetrics & Gynecology

## 2020-02-16 ENCOUNTER — Encounter (HOSPITAL_COMMUNITY)
Admission: RE | Disposition: A | Discharge status: Home / Self Care | Payer: Self-pay | Source: Home / Self Care | Attending: Obstetrics & Gynecology

## 2020-02-16 DIAGNOSIS — Z20822 Contact with and (suspected) exposure to covid-19: Secondary | ICD-10-CM | POA: Diagnosis present

## 2020-02-16 DIAGNOSIS — O34211 Maternal care for low transverse scar from previous cesarean delivery: Secondary | ICD-10-CM | POA: Diagnosis present

## 2020-02-16 DIAGNOSIS — Z3A39 39 weeks gestation of pregnancy: Secondary | ICD-10-CM

## 2020-02-16 DIAGNOSIS — Z98891 History of uterine scar from previous surgery: Secondary | ICD-10-CM

## 2020-02-16 DIAGNOSIS — Z302 Encounter for sterilization: Secondary | ICD-10-CM | POA: Diagnosis not present

## 2020-02-16 SURGERY — Surgical Case
Anesthesia: Spinal | Wound class: Clean Contaminated

## 2020-02-16 MED ORDER — ONDANSETRON HCL 4 MG/2ML IJ SOLN
INTRAMUSCULAR | Status: DC | PRN
  Administered 2020-02-16: 4 mg via INTRAVENOUS

## 2020-02-16 MED ORDER — IBUPROFEN 800 MG PO TABS
800.0000 mg | ORAL_TABLET | Freq: Four times a day (QID) | ORAL | Status: DC
  Administered 2020-02-16 – 2020-02-19 (×10): 800 mg via ORAL
  Filled 2020-02-16 (×10): qty 1

## 2020-02-16 MED ORDER — PHENYLEPHRINE HCL-NACL 20-0.9 MG/250ML-% IV SOLN
INTRAVENOUS | Status: DC | PRN
  Administered 2020-02-16: 60 ug/min via INTRAVENOUS

## 2020-02-16 MED ORDER — OXYTOCIN-SODIUM CHLORIDE 30-0.9 UT/500ML-% IV SOLN
2.5000 [IU]/h | INTRAVENOUS | Status: AC
  Administered 2020-02-16: 2.5 [IU]/h via INTRAVENOUS
  Filled 2020-02-16: qty 500

## 2020-02-16 MED ORDER — LACTATED RINGERS IV SOLN: INTRAVENOUS | Status: DC | PRN

## 2020-02-16 MED ORDER — DIBUCAINE (PERIANAL) 1 % EX OINT: 1.0000 "application " | TOPICAL_OINTMENT | CUTANEOUS | Status: DC | PRN

## 2020-02-16 MED ORDER — TETANUS-DIPHTH-ACELL PERTUSSIS 5-2.5-18.5 LF-MCG/0.5 IM SUSP: 0.5000 mL | Freq: Once | INTRAMUSCULAR | Status: DC

## 2020-02-16 MED ORDER — NALBUPHINE HCL 10 MG/ML IJ SOLN: 5.0000 mg | INTRAMUSCULAR | Status: DC | PRN

## 2020-02-16 MED ORDER — ACETAMINOPHEN 500 MG PO TABS: 1000.0000 mg | ORAL_TABLET | Freq: Once | ORAL | Status: DC

## 2020-02-16 MED ORDER — OXYCODONE-ACETAMINOPHEN 5-325 MG PO TABS
1.0000 | ORAL_TABLET | ORAL | Status: DC | PRN
  Administered 2020-02-16: 1 via ORAL
  Administered 2020-02-17: 2 via ORAL
  Administered 2020-02-17: 1 via ORAL
  Administered 2020-02-17: 2 via ORAL
  Administered 2020-02-17: 1 via ORAL
  Administered 2020-02-17 (×2): 2 via ORAL
  Administered 2020-02-18 – 2020-02-19 (×5): 1 via ORAL
  Administered 2020-02-19: 2 via ORAL
  Administered 2020-02-19 (×2): 1 via ORAL
  Filled 2020-02-16: qty 1
  Filled 2020-02-16: qty 2
  Filled 2020-02-16 (×2): qty 1
  Filled 2020-02-16: qty 2
  Filled 2020-02-16 (×5): qty 1
  Filled 2020-02-16: qty 2
  Filled 2020-02-16: qty 1
  Filled 2020-02-16: qty 2
  Filled 2020-02-16: qty 1
  Filled 2020-02-16: qty 2

## 2020-02-16 MED ORDER — ZOLPIDEM TARTRATE 5 MG PO TABS: 5.0000 mg | ORAL_TABLET | Freq: Every evening | ORAL | Status: DC | PRN

## 2020-02-16 MED ORDER — BUPIVACAINE IN DEXTROSE 0.75-8.25 % IT SOLN
INTRATHECAL | Status: DC | PRN
  Administered 2020-02-16: 1.8 mL via INTRATHECAL

## 2020-02-16 MED ORDER — FENTANYL CITRATE (PF) 100 MCG/2ML IJ SOLN
INTRAMUSCULAR | Status: DC | PRN
  Administered 2020-02-16: 15 ug via INTRATHECAL

## 2020-02-16 MED ORDER — KETOROLAC TROMETHAMINE 30 MG/ML IJ SOLN
30.0000 mg | Freq: Four times a day (QID) | INTRAMUSCULAR | Status: AC | PRN
  Administered 2020-02-16: 30 mg via INTRAVENOUS
  Filled 2020-02-16: qty 1

## 2020-02-16 MED ORDER — SIMETHICONE 80 MG PO CHEW
80.0000 mg | CHEWABLE_TABLET | ORAL | Status: DC | PRN
  Administered 2020-02-17: 80 mg via ORAL

## 2020-02-16 MED ORDER — CEFAZOLIN SODIUM-DEXTROSE 2-3 GM-%(50ML) IV SOLR
INTRAVENOUS | Status: DC | PRN
  Administered 2020-02-16: 2 g via INTRAVENOUS

## 2020-02-16 MED ORDER — FENTANYL CITRATE (PF) 100 MCG/2ML IJ SOLN
INTRAMUSCULAR | Status: AC
  Filled 2020-02-16: qty 2

## 2020-02-16 MED ORDER — SODIUM CHLORIDE 0.9% FLUSH: 3.0000 mL | INTRAVENOUS | Status: DC | PRN

## 2020-02-16 MED ORDER — DIPHENHYDRAMINE HCL 25 MG PO CAPS
25.0000 mg | ORAL_CAPSULE | ORAL | Status: DC | PRN
  Administered 2020-02-17: 25 mg via ORAL
  Filled 2020-02-16: qty 1

## 2020-02-16 MED ORDER — MORPHINE SULFATE (PF) 0.5 MG/ML IJ SOLN
INTRAMUSCULAR | Status: DC | PRN
  Administered 2020-02-16: .15 ug via INTRATHECAL

## 2020-02-16 MED ORDER — PROMETHAZINE HCL 25 MG/ML IJ SOLN: 6.2500 mg | INTRAMUSCULAR | Status: DC | PRN

## 2020-02-16 MED ORDER — LACTATED RINGERS IV SOLN: INTRAVENOUS | Status: DC

## 2020-02-16 MED ORDER — KETOROLAC TROMETHAMINE 30 MG/ML IJ SOLN
INTRAMUSCULAR | Status: AC
  Filled 2020-02-16: qty 1

## 2020-02-16 MED ORDER — NALOXONE HCL 4 MG/10ML IJ SOLN
1.0000 ug/kg/h | INTRAVENOUS | Status: DC | PRN
  Filled 2020-02-16: qty 5

## 2020-02-16 MED ORDER — SIMETHICONE 80 MG PO CHEW
80.0000 mg | CHEWABLE_TABLET | Freq: Three times a day (TID) | ORAL | Status: DC
  Administered 2020-02-16 – 2020-02-19 (×9): 80 mg via ORAL
  Filled 2020-02-16 (×9): qty 1

## 2020-02-16 MED ORDER — SODIUM CHLORIDE 0.9 % IV SOLN: INTRAVENOUS | Status: DC | PRN

## 2020-02-16 MED ORDER — KETOROLAC TROMETHAMINE 30 MG/ML IJ SOLN
30.0000 mg | Freq: Once | INTRAMUSCULAR | Status: AC
  Administered 2020-02-16: 30 mg via INTRAVENOUS

## 2020-02-16 MED ORDER — ACETAMINOPHEN 160 MG/5ML PO SOLN: 1000.0000 mg | Freq: Once | ORAL | Status: DC

## 2020-02-16 MED ORDER — COCONUT OIL OIL: 1.0000 "application " | TOPICAL_OIL | Status: DC | PRN

## 2020-02-16 MED ORDER — ONDANSETRON HCL 4 MG/2ML IJ SOLN: 4.0000 mg | Freq: Three times a day (TID) | INTRAMUSCULAR | Status: DC | PRN

## 2020-02-16 MED ORDER — MORPHINE SULFATE (PF) 0.5 MG/ML IJ SOLN
INTRAMUSCULAR | Status: AC
  Filled 2020-02-16: qty 10

## 2020-02-16 MED ORDER — SODIUM CHLORIDE 0.9 % IR SOLN
Status: DC | PRN
  Administered 2020-02-16: 1000 mL

## 2020-02-16 MED ORDER — WITCH HAZEL-GLYCERIN EX PADS: 1.0000 "application " | MEDICATED_PAD | CUTANEOUS | Status: DC | PRN

## 2020-02-16 MED ORDER — ACETAMINOPHEN 325 MG PO TABS
650.0000 mg | ORAL_TABLET | ORAL | Status: DC | PRN
  Filled 2020-02-16: qty 2

## 2020-02-16 MED ORDER — NALOXONE HCL 0.4 MG/ML IJ SOLN: 0.4000 mg | INTRAMUSCULAR | Status: DC | PRN

## 2020-02-16 MED ORDER — CEFAZOLIN SODIUM-DEXTROSE 2-4 GM/100ML-% IV SOLN
INTRAVENOUS | Status: AC
  Filled 2020-02-16: qty 100

## 2020-02-16 MED ORDER — ACETAMINOPHEN 500 MG PO TABS
1000.0000 mg | ORAL_TABLET | Freq: Four times a day (QID) | ORAL | Status: AC
  Administered 2020-02-16: 1000 mg via ORAL
  Filled 2020-02-16: qty 2

## 2020-02-16 MED ORDER — DIPHENHYDRAMINE HCL 25 MG PO CAPS: 25.0000 mg | ORAL_CAPSULE | Freq: Four times a day (QID) | ORAL | Status: DC | PRN

## 2020-02-16 MED ORDER — KETOROLAC TROMETHAMINE 30 MG/ML IJ SOLN: 30.0000 mg | Freq: Four times a day (QID) | INTRAMUSCULAR | Status: AC | PRN

## 2020-02-16 MED ORDER — PHENYLEPHRINE HCL-NACL 20-0.9 MG/250ML-% IV SOLN
INTRAVENOUS | Status: AC
  Filled 2020-02-16: qty 250

## 2020-02-16 MED ORDER — OXYTOCIN-SODIUM CHLORIDE 30-0.9 UT/500ML-% IV SOLN
INTRAVENOUS | Status: DC | PRN
  Administered 2020-02-16: 30 [IU] via INTRAVENOUS

## 2020-02-16 MED ORDER — NALBUPHINE HCL 10 MG/ML IJ SOLN: 5.0000 mg | Freq: Once | INTRAMUSCULAR | Status: DC | PRN

## 2020-02-16 MED ORDER — PRENATAL MULTIVITAMIN CH
1.0000 | ORAL_TABLET | Freq: Every day | ORAL | Status: DC
  Administered 2020-02-16 – 2020-02-19 (×4): 1 via ORAL
  Filled 2020-02-16 (×4): qty 1

## 2020-02-16 MED ORDER — DIPHENHYDRAMINE HCL 50 MG/ML IJ SOLN: 12.5000 mg | INTRAMUSCULAR | Status: DC | PRN

## 2020-02-16 MED ORDER — SENNOSIDES-DOCUSATE SODIUM 8.6-50 MG PO TABS
2.0000 | ORAL_TABLET | ORAL | Status: DC
  Administered 2020-02-17 – 2020-02-19 (×3): 2 via ORAL
  Filled 2020-02-16 (×3): qty 2

## 2020-02-16 MED ORDER — MENTHOL 3 MG MT LOZG: 1.0000 | LOZENGE | OROMUCOSAL | Status: DC | PRN

## 2020-02-16 MED ORDER — FENTANYL CITRATE (PF) 100 MCG/2ML IJ SOLN: 25.0000 ug | INTRAMUSCULAR | Status: DC | PRN

## 2020-02-16 MED ORDER — STERILE WATER FOR IRRIGATION IR SOLN
Status: DC | PRN
  Administered 2020-02-16: 1000 mL

## 2020-02-16 MED ORDER — ONDANSETRON HCL 4 MG/2ML IJ SOLN
INTRAMUSCULAR | Status: AC
  Filled 2020-02-16: qty 2

## 2020-02-16 MED ORDER — CEFAZOLIN SODIUM-DEXTROSE 2-4 GM/100ML-% IV SOLN: 2.0000 g | INTRAVENOUS | Status: DC

## 2020-02-16 MED ORDER — SIMETHICONE 80 MG PO CHEW
80.0000 mg | CHEWABLE_TABLET | ORAL | Status: DC
  Administered 2020-02-17 – 2020-02-19 (×2): 80 mg via ORAL
  Filled 2020-02-16 (×3): qty 1

## 2020-02-16 SURGICAL SUPPLY — 33 items
BENZOIN TINCTURE PRP APPL 2/3 (GAUZE/BANDAGES/DRESSINGS) ×3 IMPLANT
CHLORAPREP W/TINT 26 (MISCELLANEOUS) ×3 IMPLANT
CLAMP CORD UMBIL (MISCELLANEOUS) IMPLANT
CLOSURE WOUND 1/2 X4 (GAUZE/BANDAGES/DRESSINGS) ×1
CLOTH BEACON ORANGE TIMEOUT ST (SAFETY) ×3 IMPLANT
DERMABOND ADVANCED (GAUZE/BANDAGES/DRESSINGS)
DERMABOND ADVANCED .7 DNX12 (GAUZE/BANDAGES/DRESSINGS) IMPLANT
DRSG OPSITE POSTOP 4X10 (GAUZE/BANDAGES/DRESSINGS) ×6 IMPLANT
ELECT REM PT RETURN 9FT ADLT (ELECTROSURGICAL) ×3
ELECTRODE REM PT RTRN 9FT ADLT (ELECTROSURGICAL) ×1 IMPLANT
EXTRACTOR VACUUM KIWI (MISCELLANEOUS) IMPLANT
GLOVE BIO SURGEON STRL SZ 6 (GLOVE) ×3 IMPLANT
GLOVE BIOGEL PI IND STRL 6 (GLOVE) ×2 IMPLANT
GLOVE BIOGEL PI IND STRL 7.0 (GLOVE) ×1 IMPLANT
GLOVE BIOGEL PI INDICATOR 6 (GLOVE) ×4
GLOVE BIOGEL PI INDICATOR 7.0 (GLOVE) ×2
GOWN STRL REUS W/TWL LRG LVL3 (GOWN DISPOSABLE) ×6 IMPLANT
KIT ABG SYR 3ML LUER SLIP (SYRINGE) ×3 IMPLANT
NEEDLE HYPO 25X5/8 SAFETYGLIDE (NEEDLE) ×3 IMPLANT
NS IRRIG 1000ML POUR BTL (IV SOLUTION) ×3 IMPLANT
PACK C SECTION WH (CUSTOM PROCEDURE TRAY) ×3 IMPLANT
PAD OB MATERNITY 4.3X12.25 (PERSONAL CARE ITEMS) ×3 IMPLANT
PENCIL SMOKE EVAC W/HOLSTER (ELECTROSURGICAL) ×3 IMPLANT
STRIP CLOSURE SKIN 1/2X4 (GAUZE/BANDAGES/DRESSINGS) ×2 IMPLANT
SUT CHROMIC 0 CTX 36 (SUTURE) ×9 IMPLANT
SUT MON AB 2-0 CT1 27 (SUTURE) ×3 IMPLANT
SUT PDS AB 0 CT1 27 (SUTURE) ×3 IMPLANT
SUT PLAIN 0 NONE (SUTURE) IMPLANT
SUT VIC AB 0 CT1 36 (SUTURE) IMPLANT
SUT VIC AB 4-0 KS 27 (SUTURE) IMPLANT
TOWEL OR 17X24 6PK STRL BLUE (TOWEL DISPOSABLE) ×3 IMPLANT
TRAY FOLEY W/BAG SLVR 14FR LF (SET/KITS/TRAYS/PACK) IMPLANT
WATER STERILE IRR 1000ML POUR (IV SOLUTION) ×3 IMPLANT

## 2020-02-16 NOTE — Op Note (Signed)
Mary Frye PROCEDURE DATE: 02/16/2020  PREOPERATIVE DIAGNOSIS: Intrauterine pregnancy at  [redacted]w[redacted]d weeks gestation, previous C/S x 2, desires sterility  POSTOPERATIVE DIAGNOSIS: The same  PROCEDURE:  Repeat Low Transverse Cesarean Section with Bilateral Tubal Ligation  SURGEON:  Dr. Mitchel Honour  INDICATIONS: Mary Frye is a 39 y.o. [id number] at [redacted]w[redacted]d scheduled for cesarean section with bilateral tubal ligation secondary to previous C/S x 2 and desire for sterility.  The risks of cesarean section discussed with the patient included but were not limited to: bleeding which may require transfusion or reoperation; infection which may require antibiotics; injury to bowel, bladder, ureters or other surrounding organs; injury to the fetus; need for additional procedures including hysterectomy in the event of a life-threatening hemorrhage; placental abnormalities wth subsequent pregnancies, incisional problems, thromboembolic phenomenon and other postoperative/anesthesia complications. She is informed of risk of permanence, regret and failure with tubal ligation.  The patient concurred with the proposed plan, giving informed written consent for the procedure.    FINDINGS:  Viable female infant in cephalic presentation, APGARs 8,8: weight pending  Clear amniotic fluid.  Intact placenta, three vessel cord.  Grossly normal uterus, ovaries and fallopian tubes. .   ANESTHESIA:  Spinal ESTIMATED BLOOD LOSS: 400 ml SPECIMENS: Placenta kept by patient, bilateral tubal segments sent for pathology COMPLICATIONS: None immediate  PROCEDURE IN DETAIL:  The patient received intravenous antibiotics and had sequential compression devices applied to her lower extremities while in the preoperative area.  She was then taken to the operating room where spinal anesthesia was administered and was found to be adequate. She was then placed in a dorsal supine position with a leftward tilt, and prepped and draped in a sterile  manner.  A foley catheter was placed into her bladder and attached to constant gravity.  After an adequate timeout was performed, a Pfannenstiel skin incision was made with scalpel and carried through to the underlying layer of fascia. The fascia was incised in the midline and this incision was extended bilaterally using the Mayo scissors. Kocher clamps were applied to the superior aspect of the fascial incision and the underlying rectus muscles were dissected off bluntly. A similar process was carried out on the inferior aspect of the facial incision. The rectus muscles were separated in the midline bluntly and the peritoneum was entered bluntly.   A transverse hysterotomy was made with a scalpel and extended bilaterally bluntly. The bladder blade was then removed. The infant was successfully delivered, and cord was clamped and cut and infant was handed over to awaiting neonatology team. Uterine massage was then administered and the placenta delivered intact with three-vessel cord. The uterus was cleared of clot and debris.  The hysterotomy was closed with 0 chromic.  A second imbricating suture of 0-chromic was used to reinforce the incision and aid in hemostasis.  Right fallopian tube was elevated and doubly tied with plain gut suture.  Tubal knuckle was excised with hemostasis observed.  The contralateral tube was treated in the same fashion.  Uterus was returned to the abdomen and bilateral tubal sites continued to be hemostatic.  The peritoneum and rectus muscles were noted to be hemostatic and were reapproximated using 3-0 monocryl in a running fashion.  The fascia was closed with 0-PDS in a running fashion with good restoration of anatomy.  The subcutaneus tissue was copiously irrigated.  The skin was closed with 4-0 vicryl in a subcuticular fashion.  Pt tolerated the procedure will.  All counts were correct x2.  Pt went to the recovery room in stable condition.

## 2020-02-16 NOTE — Progress Notes (Signed)
No change to H&P.  Chenoa Luddy, DO 

## 2020-02-16 NOTE — Anesthesia Procedure Notes (Signed)
Spinal  Patient location during procedure: OR Start time: 02/16/2020 7:20 AM End time: 02/16/2020 7:22 AM Staffing Performed: anesthesiologist  Anesthesiologist: Kaylyn Layer, MD Preanesthetic Checklist Completed: patient identified, IV checked, risks and benefits discussed, monitors and equipment checked, pre-op evaluation and timeout performed Spinal Block Patient position: sitting Prep: DuraPrep and site prepped and draped Patient monitoring: heart rate, continuous pulse ox and blood pressure Approach: midline Location: L3-4 Injection technique: single-shot Needle Needle type: Pencan  Needle gauge: 24 G Needle length: 10 cm Assessment Sensory level: T4 Additional Notes Risks, benefits, and alternative discussed. Patient gave consent to procedure. Prepped and draped in sitting position. Clear CSF obtained after one needle pass. Positive terminal aspiration. No pain or paraesthesias with injection. Patient tolerated procedure well. Vital signs stable. Mary Greenhouse, MD

## 2020-02-16 NOTE — Lactation Note (Signed)
This note was copied from a baby's chart. Lactation Consultation Note  Patient Name: Mary Frye Date: 02/16/2020 Reason for consult: Initial assessment;Term  P3 mother whose infant is now 80 hours old.  This is a term baby at 39+0 weeks.  Mother breast fed her first child (now 37 years old) for one year and her second child (now 38 years old) for 8 months.    Mother has had a successful feeding since delivery.  Reviewed breast feeding basics with family including feeding cues, hand expression, feeding STS, how to obtain and maintain a good latch and how to keep baby awake during breast feeding.  Mother aware that he may be sleepy the first 24 hours after birth.    Encouraged to feed 8-12 times/24 hours or sooner if baby shows feeding cues.  Mother will practice hand expression before/after feedings to help with milk supply.  She was able to return demonstrate effective hand expression with visible colostrum drops.  Suggested she use her EBM to run into nipples/areolas for comfort.  Mother appreciative of suggestion.  Colostrum container provided and milk storage times reviewed.  Mom made aware of O/P services, breastfeeding support groups, community resources, and our phone # for post-discharge questions.  She has a DEBP for home use and will be a "stay at home" mother.  Father present and supportive.  Both parents very pleasant and receptive to learning.     Maternal Data Formula Feeding for Exclusion: No Has patient been taught Hand Expression?: Yes Does the patient have breastfeeding experience prior to this delivery?: Yes  Feeding Feeding Type: Breast Fed  LATCH Score                   Interventions    Lactation Tools Discussed/Used WIC Program: No   Consult Status Consult Status: Follow-up Date: 02/17/20 Follow-up type: In-patient    Mary Frye 02/16/2020, 6:09 PM

## 2020-02-16 NOTE — Anesthesia Postprocedure Evaluation (Signed)
Anesthesia Post Note  Patient: Mary Frye  Procedure(s) Performed: CESAREAN SECTION WITH BILATERAL TUBAL LIGATION (N/A )     Patient location during evaluation: PACU Anesthesia Type: Spinal Level of consciousness: awake and alert and oriented Pain management: pain level controlled Vital Signs Assessment: post-procedure vital signs reviewed and stable Respiratory status: spontaneous breathing, nonlabored ventilation and respiratory function stable Cardiovascular status: blood pressure returned to baseline Postop Assessment: no apparent nausea or vomiting and spinal receding Anesthetic complications: no   No complications documented.  Last Vitals:  Vitals:   02/16/20 0915 02/16/20 0930  BP: 96/80 96/62  Pulse: 76 67  Resp: 18 19  Temp:    SpO2: 98% 98%    Last Pain:  Vitals:   02/16/20 0900  TempSrc: Oral   Pain Goal:    LLE Motor Response: No movement due to regional block (02/16/20 0930) LLE Sensation: Tingling (02/16/20 0930) RLE Motor Response: Purposeful movement (02/16/20 0930) RLE Sensation: Tingling (02/16/20 0930)     Epidural/Spinal Function Cutaneous sensation: Tingles (02/16/20 0930), Patient able to flex knees: No (02/16/20 0930), Patient able to lift hips off bed: No (02/16/20 0930), Back pain beyond tenderness at insertion site: No (02/16/20 0930), Progressively worsening motor and/or sensory loss: No (02/16/20 0930), Bowel and/or bladder incontinence post epidural: No (02/16/20 0930)  Kaylyn Layer

## 2020-02-16 NOTE — Transfer of Care (Signed)
Immediate Anesthesia Transfer of Care Note  Patient: Mary Frye  Procedure(s) Performed: CESAREAN SECTION WITH BILATERAL TUBAL LIGATION (N/A )  Patient Location: PACU  Anesthesia Type:Spinal  Level of Consciousness: awake  Airway & Oxygen Therapy: Patient Spontanous Breathing  Post-op Assessment: Report given to RN and Post -op Vital signs reviewed and stable  Post vital signs: Reviewed and stable  Last Vitals:  Vitals Value Taken Time  BP 92/60 02/16/20 0824  Temp    Pulse 73 02/16/20 0825  Resp 17 02/16/20 0825  SpO2 97 % 02/16/20 0825  Vitals shown include unvalidated device data.  Last Pain:  Vitals:   02/16/20 0600  TempSrc: Oral         Complications: No complications documented.

## 2020-02-17 LAB — CBC
HCT: 34.2 % — ABNORMAL LOW (ref 36.0–46.0)
Hemoglobin: 11.6 g/dL — ABNORMAL LOW (ref 12.0–15.0)
MCH: 32.9 pg (ref 26.0–34.0)
MCHC: 33.9 g/dL (ref 30.0–36.0)
MCV: 96.9 fL (ref 80.0–100.0)
Platelets: 171 10*3/uL (ref 150–400)
RBC: 3.53 MIL/uL — ABNORMAL LOW (ref 3.87–5.11)
RDW: 12.7 % (ref 11.5–15.5)
WBC: 11.3 10*3/uL — ABNORMAL HIGH (ref 4.0–10.5)
nRBC: 0 % (ref 0.0–0.2)

## 2020-02-17 LAB — SURGICAL PATHOLOGY

## 2020-02-17 NOTE — Progress Notes (Signed)
Subjective: Postpartum Day 1: Cesarean Delivery Patient reports tolerating PO.    Objective: Vital signs in last 24 hours: Temp:  [97.6 F (36.4 C)-97.9 F (36.6 C)] 97.8 F (36.6 C) (06/25 0534) Pulse Rate:  [63-85] 85 (06/25 0534) Resp:  [14-20] 20 (06/25 0534) BP: (80-117)/(60-81) 117/78 (06/25 0534) SpO2:  [95 %-100 %] 100 % (06/25 0534)  Physical Exam:  General: alert, cooperative and no distress Lochia: appropriate Uterine Fundus: firm Incision: healing well DVT Evaluation: No evidence of DVT seen on physical exam.  Recent Labs    02/14/20 0837 02/17/20 0527  HGB 12.9 11.6*  HCT 37.8 34.2*    Assessment/Plan: Status post Cesarean section. Doing well postoperatively.  Continue current care D/W circumcision of female newborn boy. Risks reviewed and she states she understands and agrees Roselle Locus II 02/17/2020, 7:19 AM

## 2020-02-17 NOTE — Lactation Note (Signed)
This note was copied from a baby's chart. Lactation Consultation Note  Patient Name: Mary Frye Date: 02/17/2020 Reason for consult: Follow-up assessment Baby is 30 hours old/4% weight loss.  Mom states baby is breastfeeding on both breasts well.  She is syringe feeding 15 mls of formula after breastfeeding as ordered by pediatrician.  Baby just finished supplement and he is sleeping on mom's chest.  Instructed to continue feeding with cues and call for assist prn.  Maternal Data    Feeding    LATCH Score                   Interventions    Lactation Tools Discussed/Used     Consult Status Consult Status: Follow-up Date: 02/18/20 Follow-up type: In-patient    Huston Foley 02/17/2020, 1:58 PM

## 2020-02-18 MED ORDER — HYDROCORTISONE 1 % EX CREA
TOPICAL_CREAM | Freq: Four times a day (QID) | CUTANEOUS | Status: DC | PRN
  Filled 2020-02-18: qty 28

## 2020-02-18 NOTE — Progress Notes (Signed)
Subjective: Postpartum Day 2: Cesarean Delivery Patient reports tolerating PO, + flatus and no problems voiding.   C/O abdominal itching where prep was used Objective: Vital signs in last 24 hours: Temp:  [97.6 F (36.4 C)-98.1 F (36.7 C)] 97.6 F (36.4 C) (06/26 0524) Pulse Rate:  [79-87] 79 (06/26 0524) Resp:  [17-20] 20 (06/26 0524) BP: (115-126)/(76-85) 115/76 (06/26 0524) SpO2:  [98 %-100 %] 100 % (06/26 0524)  Physical Exam:  General: alert, cooperative and no distress Lochia: appropriate Uterine Fundus: firm Incision: healing well DVT Evaluation: No evidence of DVT seen on physical exam.  Recent Labs    02/17/20 0527  HGB 11.6*  HCT 34.2*    Assessment/Plan: Status post Cesarean section. Doing well postoperatively.  Continue current care. Hydrocortisone cream prn Roselle Locus II 02/18/2020, 6:33 AM

## 2020-02-19 MED ORDER — ACETAMINOPHEN 325 MG PO TABS: 650.0000 mg | ORAL_TABLET | Freq: Four times a day (QID) | ORAL | 0 refills | Status: DC | PRN

## 2020-02-19 MED ORDER — IBUPROFEN 800 MG PO TABS: 800.0000 mg | ORAL_TABLET | Freq: Four times a day (QID) | ORAL | 0 refills | Status: DC | PRN

## 2020-02-19 MED ORDER — OXYCODONE HCL 5 MG PO TABS: 5.0000 mg | ORAL_TABLET | ORAL | 0 refills | Status: DC | PRN

## 2020-02-19 NOTE — Lactation Note (Signed)
This note was copied from a baby's chart. Lactation Consultation Note  Patient Name: Mary Frye [id number] Date: 02/19/2020 Reason for consult: Follow-up assessment;Hyperbilirubinemia  0848 - 1517 - I conducted a discharge lactation consultation with Mary Frye. Her son, Mary Frye, was swaddled in his bassinet upon entry. Ms. Chancellor was receiving her breakfast. Pecola Leisure is now 28 hours old. Ms. Grimsley states that her milk transitioned yesterday morning and this has greatly improved baby Channing's breast feedings. She is no longer formula feeding (she states that she gave this temporarily for a very short period of time), and his bilirubin levels have improved, and he's gained weight.  She states that for the most part, breast feeding is comfortable; she has a little sore area on the underside of her nipples, and she attributes this to poor positioning overnight when latching. I provided her with comfort gels upon request.  I conducted discharge education. She will be following up with [facility]. She has a Medela Freestyle pump at home as well as a haakaa.  I recommended that she breast feed baby on demand 8-12 times a day, waking to feed as needed. I encouraged her to offer both breasts with a feeding. I shared our community breast feeding resources and encouraged her to call us if there are any concerns about feedings or baby's weight and progress. I also educated on how to manage engorgement and encouraged baby Mary Frye to be her best breast pump. I promoted the [employer] support groups.  All questions answered at this time.   Maternal Data Formula Feeding for Exclusion: No Has patient been taught Hand Expression?: Yes Does the patient have breastfeeding experience prior to this delivery?: Yes  Feeding Feeding Type: Breast Fed   Interventions Interventions: Breast feeding basics reviewed;Comfort gels  Lactation Tools Discussed/Used Tools: Comfort gels   Consult  Status Consult Status: Complete Date: 02/19/20 Follow-up type: In-patient    Walker Shadow 02/19/2020, 10:53 AM

## 2020-02-20 NOTE — Discharge Summary (Signed)
Postpartum Discharge Summary  Date of Service updated6/28/21     Patient Name: Mary Frye DOB: May 13, 1981 MRN: [id number]  Date of admission: 02/16/2020 Delivery date:02/16/2020  Delivering provider: Linda Hedges  Date of discharge: 02/20/2020  Admitting diagnosis: Previous cesarean section [Z98.891] Cesarean delivery delivered [O82] Intrauterine pregnancy: [redacted]w[redacted]d    Secondary diagnosis:  Active Problems:   Previous cesarean section   Cesarean delivery delivered  Additional problems: none    Discharge diagnosis: Term Pregnancy Delivered                                              Post partum procedures:none Augmentation: N/A Complications: None  Hospital course: Sceduled C/S   39y.o. yo [id number] admitted to the hospital 02/16/2020 for scheduled cesarean section with the following indication:Elective Repeat.Delivery details are as follows:  Membrane Rupture Time/Date: 7:42 AM ,02/16/2020   Delivery Method:C-Section, Low Transverse  Details of operation can be found in separate operative note.  Patient had an uncomplicated postpartum course.  She is ambulating, tolerating a regular diet, passing flatus, and urinating well. Patient is discharged home in stable condition on  02/20/20        Newborn Data: Birth date:02/16/2020  Birth time:7:43 AM  Gender:Female  Living status:Living  Apgars:8 ,8  Weight:3895 g     Magnesium Sulfate received: No BMZ received: No Rhophylac:No MMR:No T-DaP:Given prenatally Flu: No Transfusion:No  Physical exam  Vitals:   02/18/20 0524 02/18/20 1419 02/19/20 0302 02/19/20 0635  BP: 115/76 113/82 118/78 121/79  Pulse: 79 87 72 75  Resp: 20 16 18 18   Temp: 97.6 F (36.4 C) 97.9 F (36.6 C) 97.8 F (36.6 C) 98 F (36.7 C)  TempSrc: Oral Oral Oral Oral  SpO2: 100% 97% 98% 97%  Weight:      Height:       General: alert, cooperative and no distress Lochia: appropriate Uterine Fundus: firm Incision: Healing well with  no significant drainage DVT Evaluation: No evidence of DVT seen on physical exam. Labs: Lab Results  Component Value Date   WBC 11.3 (H) 02/17/2020   HGB 11.6 (L) 02/17/2020   HCT 34.2 (L) 02/17/2020   MCV 96.9 02/17/2020   PLT 171 02/17/2020   No flowsheet data found. Edinburgh Score: Edinburgh Postnatal Depression Scale Screening Tool 02/18/2020  I have been able to laugh and see the funny side of things. 0  I have looked forward with enjoyment to things. 0  I have blamed myself unnecessarily when things went wrong. 0  I have been anxious or worried for no good reason. 0  I have felt scared or panicky for no good reason. 0  Things have been getting on top of me. 1  I have been so unhappy that I have had difficulty sleeping. 0  I have felt sad or miserable. 1  I have been so unhappy that I have been crying. 0  The thought of harming myself has occurred to me. 0  Edinburgh Postnatal Depression Scale Total 2      After visit meds:  Allergies as of 02/19/2020   No Known Allergies     Medication List    TAKE these medications   acetaminophen 325 MG tablet Commonly known as: TYLENOL Take 2 tablets (650 mg total) by mouth every 6 (six) hours as needed for  mild pain (temperature > 101.5.). What changed:   medication strength  how much to take  when to take this  reasons to take this   cyclobenzaprine 5 MG tablet Commonly known as: FLEXERIL Take 5 mg by mouth at bedtime as needed for muscle spasms.   ibuprofen 800 MG tablet Commonly known as: ADVIL Take 1 tablet (800 mg total) by mouth every 6 (six) hours as needed.   oxyCODONE 5 MG immediate release tablet Commonly known as: Roxicodone Take 1 tablet (5 mg total) by mouth every 4 (four) hours as needed for severe pain.   prenatal multivitamin Tabs tablet Take 1 tablet by mouth daily.        Discharge home in stable condition Infant Feeding: Breast Infant Disposition:home with mother Discharge  instruction: per After Visit Summary and Postpartum booklet. Activity: Advance as tolerated. Pelvic rest for 6 weeks.  Diet: routine diet Anticipated Birth Control: BTL Postpartum Appointment:6 weeks Additional Postpartum F/U: none Future Appointments:No future appointments. Follow up Visit:      02/20/2020 Allena Katz, MD

## 2020-02-23 ENCOUNTER — Inpatient Hospital Stay (HOSPITAL_COMMUNITY): Admit: 2020-02-23 | Payer: Self-pay

## 2020-02-29 ENCOUNTER — Telehealth (HOSPITAL_COMMUNITY): Payer: Self-pay

## 2020-02-29 NOTE — Telephone Encounter (Signed)
Mom left a message with questions regarding plugged ducts and milk supply.  Attempted to return call twice.  No answer. Message left

## 2020-03-27 DIAGNOSIS — Z01419 Encounter for gynecological examination (general) (routine) without abnormal findings: Secondary | ICD-10-CM | POA: Diagnosis not present

## 2020-03-27 DIAGNOSIS — Z1389 Encounter for screening for other disorder: Secondary | ICD-10-CM | POA: Diagnosis not present

## 2020-04-09 DIAGNOSIS — M9901 Segmental and somatic dysfunction of cervical region: Secondary | ICD-10-CM | POA: Diagnosis not present

## 2020-04-09 DIAGNOSIS — M545 Low back pain: Secondary | ICD-10-CM | POA: Diagnosis not present

## 2020-04-09 DIAGNOSIS — M546 Pain in thoracic spine: Secondary | ICD-10-CM | POA: Diagnosis not present

## 2020-04-09 DIAGNOSIS — M542 Cervicalgia: Secondary | ICD-10-CM | POA: Diagnosis not present

## 2020-04-23 DIAGNOSIS — M542 Cervicalgia: Secondary | ICD-10-CM | POA: Diagnosis not present

## 2020-04-23 DIAGNOSIS — M545 Low back pain: Secondary | ICD-10-CM | POA: Diagnosis not present

## 2020-04-23 DIAGNOSIS — M9901 Segmental and somatic dysfunction of cervical region: Secondary | ICD-10-CM | POA: Diagnosis not present

## 2020-04-23 DIAGNOSIS — M546 Pain in thoracic spine: Secondary | ICD-10-CM | POA: Diagnosis not present

## 2020-05-21 DIAGNOSIS — M546 Pain in thoracic spine: Secondary | ICD-10-CM | POA: Diagnosis not present

## 2020-05-21 DIAGNOSIS — M545 Low back pain: Secondary | ICD-10-CM | POA: Diagnosis not present

## 2020-05-21 DIAGNOSIS — M9901 Segmental and somatic dysfunction of cervical region: Secondary | ICD-10-CM | POA: Diagnosis not present

## 2020-05-21 DIAGNOSIS — M542 Cervicalgia: Secondary | ICD-10-CM | POA: Diagnosis not present

## 2020-05-30 ENCOUNTER — Ambulatory Visit (INDEPENDENT_AMBULATORY_CARE_PROVIDER_SITE_OTHER): Payer: BC Managed Care – PPO | Admitting: Plastic Surgery

## 2020-05-30 ENCOUNTER — Encounter: Payer: Self-pay | Admitting: Plastic Surgery

## 2020-05-30 ENCOUNTER — Other Ambulatory Visit: Payer: Self-pay

## 2020-05-30 VITALS — BP 112/82 | HR 76 | Temp 98.3°F | Ht 67.0 in | Wt 171.4 lb

## 2020-05-30 DIAGNOSIS — K429 Umbilical hernia without obstruction or gangrene: Secondary | ICD-10-CM

## 2020-05-30 NOTE — Progress Notes (Signed)
Referring Provider Red[place], [place], PA [address] [place],  [place] [address]   CC:  Chief Complaint  Patient presents with  . Advice Only      Mary Frye is an 39 y.o. female.  HPI: Patient presents for evaluation of umbilical hernia with rectus diastases.  She recently had her third child via C-section.  She had the umbilical hernia since her first child.  Her most recent son was born around 4 months ago.  She reports intermittent pain from her hernia and she is bothered by the external appearance of her abdomen.  No Known Allergies  Outpatient Encounter Medications as of 05/30/2020  Medication Sig  . acetaminophen (TYLENOL) 325 MG tablet Take 2 tablets (650 mg total) by mouth every 6 (six) hours as needed for mild pain (temperature > 101.5.).  Marland Kitchen cyclobenzaprine (FLEXERIL) 5 MG tablet Take 5 mg by mouth at bedtime as needed for muscle spasms.   Marland Kitchen ibuprofen (ADVIL) 800 MG tablet Take 1 tablet (800 mg total) by mouth every 6 (six) hours as needed.  . Prenatal Vit-Fe Fumarate-FA (PRENATAL MULTIVITAMIN) TABS tablet Take 1 tablet by mouth daily.   . [DISCONTINUED] oxyCODONE (ROXICODONE) 5 MG immediate release tablet Take 1 tablet (5 mg total) by mouth every 4 (four) hours as needed for severe pain.   No facility-administered encounter medications on file as of 05/30/2020.     Past Medical History:  Diagnosis Date  . History of back injury    horse injury trt with physical therapy  . History of concussion    years ago horse injury  . Hx of varicella   . Supraumbilical hernia    small  . Vaginal Pap smear, abnormal     Past Surgical History:  Procedure Laterality Date  . CESAREAN SECTION N/A 12/17/2015   Procedure: CESAREAN SECTION;  Surgeon: Harold Hedge, MD;  Location: [facility];  Service: Obstetrics;  Laterality: N/A;  . CESAREAN SECTION N/A 11/12/2017   Procedure: REPEAT CESAREAN SECTION;  Surgeon: Zelphia Cairo, MD;  Location: [facility] BIRTHING SUITES;   Service: Obstetrics;  Laterality: N/A;  REPEAT EDC 11/18/17 [facility] NEED RNFA  . CESAREAN SECTION WITH BILATERAL TUBAL LIGATION N/A 02/16/2020   Procedure: CESAREAN SECTION WITH BILATERAL TUBAL LIGATION;  Surgeon: Mitchel Honour, DO;  Location: [facility] LD ORS;  Service: Obstetrics;  Laterality: N/A;  . dental implants     39 yo  . FOOT SURGERY    . LEEP      Family History  Problem Relation Age of Onset  . Seizures Maternal Aunt   . Cancer Maternal Aunt        breast  . Cancer Maternal Aunt   . Leukemia Father     Social History   Social History Narrative  . Not on file  Denies tobacco use  Review of Systems General: Denies fevers, chills, weight loss CV: Denies chest pain, shortness of breath, palpitations  Physical Exam Vitals with BMI 05/30/2020 02/19/2020 02/19/2020  Height 5\' 7"  - -  Weight 171 lbs 6 oz - -  BMI 26.84 - -  Systolic 112 121  Diastolic 82 79 78  Pulse 76 75 72    General:  No acute distress,  Alert and oriented, Non-Toxic, Normal speech and affect Abdomen: She has an obvious umbilical hernia.  I suspect she also has rectus diastases that is more obvious in the standing position.  She does have stretch marks in excess skin.  She does not  have a lot of excess extra-abdominal fat.  Assessment/Plan Patient presents with a symptomatic umbilical umbilical hernia and postpartum abdominal contour deformity.  I explained from my standpoint I would want to wait for at least 6 more months prior to considering any abdominal contouring operation to return to more of a steady state after her pregnancy.  In regard to the umbilical hernia I would recommend consultation with a general surgeon which we could potentially do as a joint case depending on her wishes and timeline.  I discussed the details of but would effectively be an abdominoplasty with her in detail today.  I explained the rectus muscles could be centralized at the midline and all excess skin would be removed at the  same time.  I gave her the names of a couple general surgeons in town to consult with if she wanted to pursue that on an earlier basis.  She is going to think about the various ways to proceed and let us know how she would like to move forward.  [name] 05/30/2020, 2:42 PM

## 2020-06-04 DIAGNOSIS — O99345 Other mental disorders complicating the puerperium: Secondary | ICD-10-CM | POA: Diagnosis not present

## 2020-06-18 DIAGNOSIS — M545 Low back pain, unspecified: Secondary | ICD-10-CM | POA: Diagnosis not present

## 2020-06-18 DIAGNOSIS — M542 Cervicalgia: Secondary | ICD-10-CM | POA: Diagnosis not present

## 2020-06-18 DIAGNOSIS — M9901 Segmental and somatic dysfunction of cervical region: Secondary | ICD-10-CM | POA: Diagnosis not present

## 2020-06-18 DIAGNOSIS — M546 Pain in thoracic spine: Secondary | ICD-10-CM | POA: Diagnosis not present

## 2020-06-27 DIAGNOSIS — Z23 Encounter for immunization: Secondary | ICD-10-CM | POA: Diagnosis not present

## 2020-06-27 DIAGNOSIS — F900 Attention-deficit hyperactivity disorder, predominantly inattentive type: Secondary | ICD-10-CM | POA: Diagnosis not present

## 2020-06-28 DIAGNOSIS — K429 Umbilical hernia without obstruction or gangrene: Secondary | ICD-10-CM | POA: Diagnosis not present

## 2020-07-02 DIAGNOSIS — Z13220 Encounter for screening for lipoid disorders: Secondary | ICD-10-CM | POA: Diagnosis not present

## 2020-07-02 DIAGNOSIS — Z131 Encounter for screening for diabetes mellitus: Secondary | ICD-10-CM | POA: Diagnosis not present

## 2020-07-02 DIAGNOSIS — M6208 Separation of muscle (nontraumatic), other site: Secondary | ICD-10-CM | POA: Diagnosis not present

## 2020-07-02 DIAGNOSIS — K429 Umbilical hernia without obstruction or gangrene: Secondary | ICD-10-CM | POA: Diagnosis not present

## 2020-07-02 DIAGNOSIS — Z0000 Encounter for general adult medical examination without abnormal findings: Secondary | ICD-10-CM | POA: Diagnosis not present

## 2020-07-31 NOTE — Progress Notes (Signed)
ICD-10-CM   1. Encounter for cosmetic surgery  Z41.1   2. Diastasis of rectus abdominis  M62.08   3. Umbilical hernia without obstruction and without gangrene  K42.9       Patient ID: Mary Frye, female    DOB: 09-28-1980, 39 y.o.   MRN: [id number]   History of Present Illness: Mary Frye is a 39 y.o.  female  with a history of a supraumbilical hernia and diastases rectus.  She presents for preoperative evaluation for upcoming procedure, hernia repair with Dr. Dwain Sarna and abdominoplasty with Dr. Arita Miss, scheduled for 08/13/2020.  Summary from previous visit: Patient has a history of 3 C-sections.  Her most recent son was in June at which time she also underwent a tubal ligation.  She has an umbilical hernia and diastases rectus.  She reports intermittent pain from her hernia and is bothered by the external appearance of her abdomen.  She does have stretch marks and excess skin.  She does not have a lot of excess extra-abdominal fat.  Patient stopped breast feeding a few months ago.   PMH Significant for: 3 C-sections, tubal ligation, Hx concussion and back injury from a horse accident, supraumbilical hernia, diastases rectus  The patient has not had problems with anesthesia.   Past Medical History: Allergies: No Known Allergies  Current Medications:  Current Outpatient Medications:  .  amphetamine-dextroamphetamine (ADDERALL) 20 MG tablet, 1 tablet, Disp: , Rfl:  .  cyclobenzaprine (FLEXERIL) 5 MG tablet, Take 5 mg by mouth at bedtime as needed for muscle spasms. , Disp: , Rfl:  .  Prenatal Vit-Fe Fumarate-FA (PRENATAL MULTIVITAMIN) TABS tablet, Take 1 tablet by mouth daily. , Disp: , Rfl:  .  sertraline (ZOLOFT) 50 MG tablet, 1 tablet, Disp: , Rfl:   Past Medical Problems: Past Medical History:  Diagnosis Date  . History of back injury    horse injury trt with physical therapy  . History of concussion    years ago horse injury  . Hx of varicella   . Supraumbilical  hernia    small  . Vaginal Pap smear, abnormal     Past Surgical History: Past Surgical History:  Procedure Laterality Date  . CESAREAN SECTION N/A 12/17/2015   Procedure: CESAREAN SECTION;  Surgeon: Harold Hedge, MD;  Location: [facility];  Service: Obstetrics;  Laterality: N/A;  . CESAREAN SECTION N/A 11/12/2017   Procedure: REPEAT CESAREAN SECTION;  Surgeon: Zelphia Cairo, MD;  Location: [facility] BIRTHING SUITES;  Service: Obstetrics;  Laterality: N/A;  REPEAT EDC 11/18/17 [facility] NEED RNFA  . CESAREAN SECTION WITH BILATERAL TUBAL LIGATION N/A 02/16/2020   Procedure: CESAREAN SECTION WITH BILATERAL TUBAL LIGATION;  Surgeon: Mitchel Honour, DO;  Location: [facility] LD [facility]S;  Service: Obstetrics;  Laterality: N/A;  . dental implants     39 yo  . FOOT SURGERY    . LEEP      Social History: Social History   Socioeconomic History  . Marital status: Married    Spouse name: Not on file  . Number of children: Not on file  . Years of education: Not on file  . Highest education level: Not on file  Occupational History  . Not on file  Tobacco Use  . Smoking status: Never Smoker  . Smokeless tobacco: Never Used  Vaping Use  . Vaping Use: Never used  Substance and Sexual Activity  . Alcohol use: No  . Drug use: No  . Sexual activity: Not on file  Other Topics Concern  . Not on file  Social History Narrative  . Not on file   Social Determinants of Health   Financial Resource Strain: Not on file  Food Insecurity: Not on file  Transportation Needs: Not on file  Physical Activity: Not on file  Stress: Not on file  Social Connections: Not on file  Intimate Partner Violence: Not on file    Family History: Family History  Problem Relation Age of Onset  . Seizures Maternal Aunt   . Cancer Maternal Aunt        breast  . Cancer Maternal Aunt   . Leukemia Father     Review of Systems: Review of Systems  Constitutional: Negative for chills and fever.  HENT: Negative for congestion and  sore throat.   Respiratory: Negative for cough and shortness of breath.   Cardiovascular: Negative for chest pain.  Gastrointestinal: Negative for abdominal pain, nausea and vomiting.  Skin: Negative for itching and rash.    Physical Exam: Vital Signs BP 110/75 (BP Location: Right Arm, Patient Position: Sitting, Cuff Size: Normal)   Pulse 85   Temp 98.2 F (36.8 C) (Oral)   Ht 5\' 7"  (1.702 m)   Wt 161 lb 12.8 oz (73.4 kg)   SpO2 97%   BMI 25.34 kg/m  Physical Exam Vitals and nursing note reviewed.  Constitutional:      General: She is not in acute distress.    Appearance: Normal appearance. She is normal weight. She is not ill-appearing.  HENT:     Head: Normocephalic and atraumatic.  Eyes:     Extraocular Movements: Extraocular movements intact.  Cardiovascular:     Rate and Rhythm: Normal rate and regular rhythm.     Pulses: Normal pulses.     Heart sounds: Normal heart sounds.  Pulmonary:     Effort: Pulmonary effort is normal.     Breath sounds: Normal breath sounds. No wheezing, rhonchi or rales.  Abdominal:     General: Bowel sounds are normal.     Palpations: Abdomen is soft.     Comments: Excess abdominal tissue. Supra umbilical hernia present  Musculoskeletal:        General: No swelling. Normal range of motion.     Cervical back: Normal range of motion.  Skin:    General: Skin is warm and dry.     Coloration: Skin is not pale.     Findings: No erythema or rash.  Neurological:     General: No focal deficit present.     Mental Status: She is alert and oriented to person, place, and time.  Psychiatric:        Mood and Affect: Mood normal.        Behavior: Behavior normal.        Thought Content: Thought content normal.        Judgment: Judgment normal.     Assessment/Plan:  Mary Frye scheduled for hernia repair with Dr. Jethro Bastos and abdominoplasty with Dr. Dwain Sarna.  Risks, benefits, and alternatives of procedure discussed, questions answered and consent  obtained.    Smoking Status: non-smoker; Counseling Given? n/A  Caprini Score: 3 Moderate; Risk Factors include: 39 year old female, BMI > 25, and length of planned surgery. Recommendation for mechanical or pharmacological prophylaxis during surgery. Encourage early ambulation.   Pictures obtained: 08/02/20  Post-op Rx sent to pharmacy: Norco, zofran, Ibu, tylenol  Patient was provided with the abdominoplasty risks and General Surgical Risk consent document and Pain Medication Agreement  prior to their appointment.  They had adequate time to read through the risk consent documents and Pain Medication Agreement. We also discussed them in person together during this preop appointment. All of their questions were answered to their satisfaction.  Recommended calling if they have any further questions.  Risk consent form and Pain Medication Agreement to be scanned into patient's chart.  The risk that can be encountered for this procedure were discussed and include the following but not limited to these: asymmetry, fluid accumulation, firmness of the tissue, skin loss, decrease or no sensation, fat necrosis, bleeding, infection, healing delay.  Deep vein thrombosis, cardiac and pulmonary complications are risks to any procedure.  There are risks of anesthesia, changes to skin sensation and injury to nerves or blood vessels.  The muscle can be temporarily or permanently injured.  You may have an allergic reaction to tape, suture, glue, blood products which can result in skin discoloration, swelling, pain, skin lesions, poor healing.  Any of these can lead to the need for revisonal surgery or stage procedures.  Weight gain and weigh loss can also effect the long term appearance. The results are not guaranteed to last a lifetime.  Future surgery may be required.    Electronically signed by: Eldridge Abrahams, PA-C 08/02/2020 10:47 AM

## 2020-08-02 ENCOUNTER — Encounter: Payer: Self-pay | Admitting: Plastic Surgery

## 2020-08-02 ENCOUNTER — Other Ambulatory Visit: Payer: Self-pay

## 2020-08-02 ENCOUNTER — Ambulatory Visit (INDEPENDENT_AMBULATORY_CARE_PROVIDER_SITE_OTHER): Payer: BC Managed Care – PPO | Admitting: Plastic Surgery

## 2020-08-02 VITALS — BP 110/75 | HR 85 | Temp 98.2°F | Ht 67.0 in | Wt 161.8 lb

## 2020-08-02 DIAGNOSIS — M6208 Separation of muscle (nontraumatic), other site: Secondary | ICD-10-CM

## 2020-08-02 DIAGNOSIS — Z719 Counseling, unspecified: Secondary | ICD-10-CM

## 2020-08-02 DIAGNOSIS — Z411 Encounter for cosmetic surgery: Secondary | ICD-10-CM

## 2020-08-02 DIAGNOSIS — K429 Umbilical hernia without obstruction or gangrene: Secondary | ICD-10-CM

## 2020-08-02 MED ORDER — HYDROCODONE-ACETAMINOPHEN 5-325 MG PO TABS: 1.0000 | ORAL_TABLET | Freq: Three times a day (TID) | ORAL | 0 refills | Status: AC | PRN

## 2020-08-02 MED ORDER — ACETAMINOPHEN 500 MG PO TABS
500.0000 mg | ORAL_TABLET | Freq: Four times a day (QID) | ORAL | 0 refills | Status: DC | PRN
Start: 2020-08-02 — End: 2020-11-02

## 2020-08-02 MED ORDER — ONDANSETRON HCL 4 MG PO TABS: 4.0000 mg | ORAL_TABLET | Freq: Three times a day (TID) | ORAL | 0 refills | Status: DC | PRN

## 2020-08-02 MED ORDER — IBUPROFEN 600 MG PO TABS
600.0000 mg | ORAL_TABLET | Freq: Four times a day (QID) | ORAL | 0 refills | Status: DC | PRN
Start: 2020-08-02 — End: 2020-11-02

## 2020-08-07 ENCOUNTER — Telehealth: Payer: Self-pay

## 2020-08-07 NOTE — Telephone Encounter (Signed)
Patient is scheduled for surgery next week and is concerned about possible allergic reactions.  Patient said when she had her c-sections, she had a rash that she thinks originated from the solution they rubbed on her, she said it may have been betadine.  She wants to know if there are some alternatives.  Also, in the past, patient said that she has had a reaction to the adhesive from bandages that were used.  She is concerned about having another skin reaction.  Patient is wondering if spending the night after her surgery is an option.  Please call.

## 2020-08-13 DIAGNOSIS — Z411 Encounter for cosmetic surgery: Secondary | ICD-10-CM | POA: Diagnosis not present

## 2020-08-13 DIAGNOSIS — K420 Umbilical hernia with obstruction, without gangrene: Secondary | ICD-10-CM | POA: Diagnosis not present

## 2020-08-13 DIAGNOSIS — K429 Umbilical hernia without obstruction or gangrene: Secondary | ICD-10-CM | POA: Diagnosis not present

## 2020-08-15 NOTE — Progress Notes (Signed)
Patient is a 39 year old female here for follow-up after undergoing hernia repair with Dr. Dwain Sarna and abdominoplasty with Dr. Arita Miss on 08/13/2020.  She has concerns for right side abdominal swelling.  ~ 3 days PO Patient presents today with concerns for right side abdominal swelling.  Upon exam all incisions are healing very nicely, C/D/I.  No signs of infection or drainage from the incisions.  Normal expected diffuse swelling present at this time.  Area of patient's concern shows some bruising which is normal with liposuction.  Suspect binder folds and presses in this area which is increasing her pain.  Navel is viable with good cap refill present.  Bilateral drains in place with serosanguineous fluid in bulbs.  Drain output is too high to remove today.  Patient is very nauseous at today's visit.  Recommend utilizing the Zofran as needed.  Continue wearing compression 24/7 until 6 weeks postop.  Recommend switching to spanks style compression garment for improved comfort.  She may also try padding the right side for improved comfort.  Continue to avoid heavy lifting and vigorous activity.  Continue to sleep with slight bend to keep tension off the abdominal incision.  Patient has been taking her hydrocodone every 6 hours and so is almost out of her pain medication.  A refill was provided today and patient was instructed that she needs to try to increase the time between the narcotics and continue using the ibuprofen and Tylenol as first-line treatment for pain.  She may also use ice, avoiding the incisions.  Keep follow-up appointment scheduled for next week.  Return precautions provided.  Call office with any questions/concerns.

## 2020-08-16 ENCOUNTER — Ambulatory Visit (INDEPENDENT_AMBULATORY_CARE_PROVIDER_SITE_OTHER): Payer: BC Managed Care – PPO | Admitting: Plastic Surgery

## 2020-08-16 ENCOUNTER — Other Ambulatory Visit: Payer: Self-pay

## 2020-08-16 ENCOUNTER — Encounter: Payer: Self-pay | Admitting: Plastic Surgery

## 2020-08-16 VITALS — BP 111/66 | HR 81 | Temp 97.8°F

## 2020-08-16 DIAGNOSIS — K429 Umbilical hernia without obstruction or gangrene: Secondary | ICD-10-CM

## 2020-08-16 DIAGNOSIS — Z9889 Other specified postprocedural states: Secondary | ICD-10-CM

## 2020-08-16 DIAGNOSIS — M6208 Separation of muscle (nontraumatic), other site: Secondary | ICD-10-CM

## 2020-08-16 MED ORDER — HYDROCODONE-ACETAMINOPHEN 5-325 MG PO TABS: 1.0000 | ORAL_TABLET | Freq: Three times a day (TID) | ORAL | 0 refills | Status: AC | PRN

## 2020-08-22 ENCOUNTER — Encounter: Payer: Self-pay | Admitting: Plastic Surgery

## 2020-08-22 ENCOUNTER — Other Ambulatory Visit: Payer: Self-pay

## 2020-08-22 ENCOUNTER — Ambulatory Visit (INDEPENDENT_AMBULATORY_CARE_PROVIDER_SITE_OTHER): Payer: BC Managed Care – PPO | Admitting: Plastic Surgery

## 2020-08-22 VITALS — BP 113/78 | HR 85 | Temp 98.4°F

## 2020-08-22 DIAGNOSIS — Z411 Encounter for cosmetic surgery: Secondary | ICD-10-CM

## 2020-08-22 MED ORDER — ONDANSETRON HCL 4 MG PO TABS: 4.0000 mg | ORAL_TABLET | Freq: Three times a day (TID) | ORAL | 0 refills | Status: DC | PRN

## 2020-08-22 MED ORDER — CELECOXIB 200 MG PO CAPS: 200.0000 mg | ORAL_CAPSULE | Freq: Two times a day (BID) | ORAL | 1 refills | Status: DC

## 2020-08-22 NOTE — Addendum Note (Signed)
Addended by: [name] on: 08/22/2020 10:10 AM   Modules accepted: Orders

## 2020-08-22 NOTE — Progress Notes (Signed)
Patient presents 1 week postop from abdominoplasty.  She overall feels well.  She feels like she has more pain on the right side.  Drains have been putting out less than 30 cc/day with slightly more draining out of the right side.  On exam everything looks to be healing fine.  The umbilicus looks viable.  There is a bit of bruising and fullness inferior to the umbilicus which may or may represent a small hematoma.  The drainage fluid coming out both sides looks like liquefied hematoma.  Otherwise the incision is intact and the skin all looks healthy and well perfused.  I removed her left-sided drain as it had only been putting out 5 to 10 cc a day.  I left the right 1 to try to capture any residual hematoma that liquefies.  Of asked her to continue compressive garments and avoid strenuous activity and we will plan to see her again next week to hopefully remove the right-sided drain.

## 2020-08-24 ENCOUNTER — Telehealth: Payer: Self-pay

## 2020-08-24 ENCOUNTER — Other Ambulatory Visit: Payer: Self-pay | Admitting: Plastic Surgery

## 2020-08-24 MED ORDER — MELOXICAM 7.5 MG PO TABS
7.5000 mg | ORAL_TABLET | Freq: Every day | ORAL | 0 refills | Status: AC
Start: 2020-08-24 — End: 2020-09-23

## 2020-08-24 NOTE — Progress Notes (Signed)
Patient reports she broke out in hives after starting Celebrex yesterday. Instructed to stop taking.  Use benadryl. Sending in new Rx for Mobic

## 2020-08-24 NOTE — Telephone Encounter (Signed)
Patient called to say that she has broken out in hives on her wrists, forearms, under her arms, and on her breast area.  Patient states that we changed her medication to Celebrex, which she started taking on Wednesday night.  She took it again Thursday morning and Thursday night.  Patient states that yesterday afternoon she started itching and she is wondering if this is a reaction to the medication.  Also, patient noticed that she is still bleeding from her drain site and she is wondering if this is ok.  Please call.

## 2020-08-24 NOTE — Telephone Encounter (Signed)
Returned patients call. Under the advise from [facility], stop taking the Celebrex. She will send in meloxicam to her pharmacy. Until then, she can alternate tylenol and ibuprofen for pain. For the hives and itching, take Allegra during the day and Benadryl at night time. Keep changing gauze/pads as needs for the drain site. May wear a tank top underneath the binder to help keep irritation from the binder.  Patient understood and agreed.

## 2020-08-26 DIAGNOSIS — Z9889 Other specified postprocedural states: Secondary | ICD-10-CM | POA: Insufficient documentation

## 2020-08-26 NOTE — Progress Notes (Signed)
Patient is a 40 year old female here for follow-up after undergoing hernia repair with Dr. Dwain Sarna and abdominoplasty with Dr. Arita Miss on 08/13/2020.  She called office last week and reported breaking out into hives after starting Celebrex the day before.  She was switched to meloxicam.  ~ 2 weeks PO Patient reports she is doing well.  Denies fever/chills, nausea/vomiting, pain at incision site.  Reports right drain output has been less than 32 cc for the last several days.  Abdominal and umbilical incisions are healing well, C/D/I.  No signs of infection or drainage.  She has an area of firmness just right of midline with some erythema overlying this area as well as some healing bruising.  Bruising extends up to the umbilicus.  Left drain site has closed up nicely.  Right drain was removed today.  Cover drain site with Vaseline and gauze for the next few days until closed.  Recommend some circular massage over the area of firmness to help body breakdown and reabsorb.  May apply Vaseline as desired to incision and areas of dryness.  Follow-up in 2 weeks.  Call office with any questions/concerns.  Return precautions provided.

## 2020-08-28 ENCOUNTER — Other Ambulatory Visit: Payer: Self-pay

## 2020-08-28 ENCOUNTER — Encounter: Payer: Self-pay | Admitting: Plastic Surgery

## 2020-08-28 ENCOUNTER — Ambulatory Visit (INDEPENDENT_AMBULATORY_CARE_PROVIDER_SITE_OTHER): Payer: BC Managed Care – PPO | Admitting: Plastic Surgery

## 2020-08-28 VITALS — BP 114/76 | HR 97 | Temp 98.5°F

## 2020-08-28 DIAGNOSIS — Z9889 Other specified postprocedural states: Secondary | ICD-10-CM

## 2020-09-12 ENCOUNTER — Encounter: Payer: Self-pay | Admitting: Plastic Surgery

## 2020-09-12 ENCOUNTER — Ambulatory Visit (INDEPENDENT_AMBULATORY_CARE_PROVIDER_SITE_OTHER): Payer: BC Managed Care – PPO | Admitting: Surgical

## 2020-09-12 ENCOUNTER — Other Ambulatory Visit: Payer: Self-pay

## 2020-09-12 VITALS — BP 111/74 | HR 75

## 2020-09-12 DIAGNOSIS — Z9889 Other specified postprocedural states: Secondary | ICD-10-CM

## 2020-09-12 NOTE — Progress Notes (Signed)
Patient is a 40 year old female here for follow-up after undergoing hernia repair with Dr. Dwain Sarna and abdominoplasty with Dr. Arita Miss on Aug 13, 2020.  Patient reports overall she is doing well, she continues to have some tenderness/firmness just right of midline abdominal incision. She reports she has been massaging this area as instructed. She is otherwise doing well and is pleased with her recovery so far.  Chaperone present on exam On exam abdominal incision intact, CDI. No erythema noted. Firmness noted just right of midline with palpation, this area is tender to palpation. There is no defined mass or area noted, no overlying skin changes noted. Umbilicus incision intact with some scabbing noted. No cellulitic changes or erythema noted throughout. Bilateral drain sites are healing well.  Continue with circular massage over the area of firmness, possibly hematoma or fat necrosis. There is no sign of infection, seroma. Continue to avoid strenuous activity, avoid heavy lifting, avoid abdominal exercises. Call with questions or concerns, recommend following up in approximately 1 month for reevaluation

## 2020-09-21 ENCOUNTER — Telehealth: Payer: Self-pay | Admitting: Surgical

## 2020-09-21 NOTE — Telephone Encounter (Signed)
Patient called to advise she is having some pain on the right side of her belly button. She is 4-5 weeks post abdominoplasty. She said it's above a hematoma/really hard place. She said she feels it more with movement. Please call patient to advise

## 2020-09-21 NOTE — Telephone Encounter (Signed)
Patient is a 40 year old female status post abdominoplasty by Dr. Arita Miss and umbilical hernia repair by general surgery.  She reports that she is having some tenderness just to the right side of her bellybutton.  She is unsure what is causing it, she reports it is uncomfortable, but manageable.  She reports she is having normal bowel movements.  She was unsure if she should call us or the general surgeon team to discuss.  I discussed with her to try some ibuprofen over the weekend, she could have been too active and caused some irritation within the abdomen, I would recommend continuing to monitor this and calling us next week if her symptoms do not resolve or improve.  She is otherwise doing well. She is in agreement with monitoring and calling us if she has any questions or concerns.  I did offer an earlier appointment than next month and she said she will continue to monitor and let us know.

## 2020-09-24 DIAGNOSIS — M9901 Segmental and somatic dysfunction of cervical region: Secondary | ICD-10-CM | POA: Diagnosis not present

## 2020-09-24 DIAGNOSIS — M546 Pain in thoracic spine: Secondary | ICD-10-CM | POA: Diagnosis not present

## 2020-09-24 DIAGNOSIS — M545 Low back pain, unspecified: Secondary | ICD-10-CM | POA: Diagnosis not present

## 2020-09-24 DIAGNOSIS — M542 Cervicalgia: Secondary | ICD-10-CM | POA: Diagnosis not present

## 2020-09-25 DIAGNOSIS — M542 Cervicalgia: Secondary | ICD-10-CM | POA: Diagnosis not present

## 2020-09-27 DIAGNOSIS — M9901 Segmental and somatic dysfunction of cervical region: Secondary | ICD-10-CM | POA: Diagnosis not present

## 2020-09-27 DIAGNOSIS — M546 Pain in thoracic spine: Secondary | ICD-10-CM | POA: Diagnosis not present

## 2020-09-27 DIAGNOSIS — M545 Low back pain, unspecified: Secondary | ICD-10-CM | POA: Diagnosis not present

## 2020-09-27 DIAGNOSIS — M542 Cervicalgia: Secondary | ICD-10-CM | POA: Diagnosis not present

## 2020-10-03 ENCOUNTER — Encounter: Payer: Self-pay | Admitting: Plastic Surgery

## 2020-10-03 ENCOUNTER — Other Ambulatory Visit: Payer: Self-pay

## 2020-10-03 ENCOUNTER — Ambulatory Visit (INDEPENDENT_AMBULATORY_CARE_PROVIDER_SITE_OTHER): Payer: BC Managed Care – PPO | Admitting: Plastic Surgery

## 2020-10-03 VITALS — BP 114/80 | HR 73

## 2020-10-03 DIAGNOSIS — M546 Pain in thoracic spine: Secondary | ICD-10-CM | POA: Diagnosis not present

## 2020-10-03 DIAGNOSIS — M9901 Segmental and somatic dysfunction of cervical region: Secondary | ICD-10-CM | POA: Diagnosis not present

## 2020-10-03 DIAGNOSIS — R1031 Right lower quadrant pain: Secondary | ICD-10-CM

## 2020-10-03 DIAGNOSIS — Z9889 Other specified postprocedural states: Secondary | ICD-10-CM

## 2020-10-03 DIAGNOSIS — M542 Cervicalgia: Secondary | ICD-10-CM | POA: Diagnosis not present

## 2020-10-03 DIAGNOSIS — M9902 Segmental and somatic dysfunction of thoracic region: Secondary | ICD-10-CM | POA: Diagnosis not present

## 2020-10-03 MED ORDER — DICLOFENAC SODIUM 1 % EX GEL: 4.0000 g | Freq: Four times a day (QID) | CUTANEOUS | Status: AC | PRN

## 2020-10-03 NOTE — Progress Notes (Signed)
Patient presents about 7 weeks out from abdominoplasty with liposuction.  This was combined with hernia repair by Dr. Dwain Sarna.  She overall is happy with the contour but has been bothered by firm painful area to the right of midline just above her scar.  She feels like there is little bit of firmness in this area.  Additionally when she sneezes or strains she feels like there is a sharp pain in that 1 spot.  Otherwise she is happy.  On exam the baby little bit of firmness or nodularity to the area in question that is infraumbilical to the right of midline.  This is fairly subtle however.  Externally there is no obvious issues with the skin and her incision has healed nicely.  Her overall shape and contour is good.  There could be a few explanations for the focal area of pain and tenderness that she describes.  It could be a small area of fat necrosis or potentially a suture either from the plication or 1 used for hemostasis for perforator.  I would expect any of these to improve with time.  We did talk about doing Voltaren gel to that area to see if it offers any improvement.  I think time will be a big factor for her.  She has been doing some gentle massage which I think will help.  We will plan to touch base with her again in another month or 2.

## 2020-10-05 ENCOUNTER — Telehealth: Payer: Self-pay

## 2020-10-05 NOTE — Telephone Encounter (Signed)
Patient called to say that she would like to talk with someone about the pain she is having to the right of her belly button.  She said that it doesn't hurt too bad if she isn't moving but she can't sit still all the time.  Patient said she is wondering if a diagnostic ultrasound would show if there is a tear.  Please call.

## 2020-10-05 NOTE — Telephone Encounter (Signed)
Called and spoke with the patient regarding the message below.  Patient stated that she's still having the pain on the right of her belly button.  The pain is sharp and burning.  She stated that when she's doing nothing the pain is a 2, but when she's bending,turning,or picking up the baby the pain level is a 5 or 6.  Asked the patient if she has used the medication (Voltaren).  Patient stated that she did but it's not working.   Patient stated that her and Dr. Arita Miss had talked about doing a Diag Ultrasound, and she would like to have it done.  Informed the patient that I will informed Dr. Arita Miss of her message and that she would like to have the US done.//AB/CMA

## 2020-10-05 NOTE — Addendum Note (Signed)
Addended by: [name] on: 10/05/2020 12:36 PM   Modules accepted: Orders

## 2020-10-05 NOTE — Telephone Encounter (Signed)
Called the patient back and informed her that I spoke with Dr. Arita Miss regarding scheduling the U/S and he placed the  order.  Informed the patient that she will be getting a call from our office regarding the [place] appointment.  Patient verbalized understanding and agreed.//AB/CMA

## 2020-10-11 ENCOUNTER — Ambulatory Visit: Payer: BC Managed Care – PPO | Admitting: Surgical

## 2020-10-12 DIAGNOSIS — K649 Unspecified hemorrhoids: Secondary | ICD-10-CM | POA: Diagnosis not present

## 2020-10-15 ENCOUNTER — Other Ambulatory Visit: Payer: Self-pay | Admitting: Plastic Surgery

## 2020-10-24 ENCOUNTER — Other Ambulatory Visit: Payer: Self-pay | Admitting: Plastic Surgery

## 2020-10-24 ENCOUNTER — Ambulatory Visit
Admission: RE | Admit: 2020-10-24 | Discharge: 2020-10-24 | Disposition: A | Discharge status: Home / Self Care | Payer: BC Managed Care – PPO | Source: Ambulatory Visit | Attending: Plastic Surgery | Admitting: Plastic Surgery

## 2020-10-24 DIAGNOSIS — Z9889 Other specified postprocedural states: Secondary | ICD-10-CM

## 2020-10-24 DIAGNOSIS — R102 Pelvic and perineal pain: Secondary | ICD-10-CM | POA: Diagnosis not present

## 2020-10-24 DIAGNOSIS — R1031 Right lower quadrant pain: Secondary | ICD-10-CM

## 2020-11-02 ENCOUNTER — Encounter: Payer: Self-pay | Admitting: Surgical

## 2020-11-02 ENCOUNTER — Other Ambulatory Visit: Payer: Self-pay

## 2020-11-02 ENCOUNTER — Ambulatory Visit (INDEPENDENT_AMBULATORY_CARE_PROVIDER_SITE_OTHER): Payer: Self-pay | Admitting: Surgical

## 2020-11-02 VITALS — BP 115/81 | HR 87

## 2020-11-02 DIAGNOSIS — Z9889 Other specified postprocedural states: Secondary | ICD-10-CM

## 2020-11-02 DIAGNOSIS — Z411 Encounter for cosmetic surgery: Secondary | ICD-10-CM

## 2020-11-02 NOTE — Progress Notes (Signed)
Patient is a 40 year old female here for follow-up after abdominoplasty with liposuction with Dr. Arita Miss.  This was combined with her hernia repair by Dr. Dwain Sarna.  She is approximately 11 weeks postop.  She was last seen in the office on 10/03/2020 and had discussed a painful area to the right of her umbilicus.  It was recommended she try Voltaren gel to this area to see if this offers any improvement.   Today she reports she is overall doing better, reports that the pain to the right side of her abdomen has improved, but she is does still feel that it is sometimes present.  She reports that she has a massage therapist friend who does cupping and is wondering if she should try cupping to her lower abdominal area where she had some postoperative fat necrosis.  She reports that she had her ultrasound on 10/24/2020 and has some questions about that.  The impression from the ultrasound showed fluid tracking through subcutaneous soft tissues, no well-formed drainable collection and no convincing hernia noted.  Chaperone present on exam On exam umbilicus incision is intact, abdominoplasty incision is intact and well-healed.  She does have a overall stable along the right lateral incision that is just below the epithelium.  It is tender.  There is no overlying skin changes.  Her abdomen is flat, no fluid collection noted with palpation.  I do not feel any areas of fat necrosis on exam.  No abnormal exam noted with palpation to the right of the abdomen/umbilicus.  We discussed the ultrasound today.  I discussed with the patient I would consult with Dr. Arita Miss on his thoughts, however based on the radiologist reading there is no abnormal findings.  We discussed using scar cream.  We discussed avoiding exposure of the incision to the sun to prevent discoloration.  We discussed she has no restrictions at this time, can continue to slowly work up to her normal activities.  We discussed scheduling an additional follow-up  or following up on an as-needed basis.  Patient is comfortable following up as needed, she will be sure to call us with any questions or concerns.  There is no sign of infection, seroma, hematoma.

## 2020-11-29 DIAGNOSIS — R102 Pelvic and perineal pain: Secondary | ICD-10-CM | POA: Diagnosis not present

## 2020-12-20 DIAGNOSIS — R102 Pelvic and perineal pain: Secondary | ICD-10-CM | POA: Diagnosis not present

## 2020-12-26 DIAGNOSIS — M62838 Other muscle spasm: Secondary | ICD-10-CM | POA: Diagnosis not present

## 2020-12-26 DIAGNOSIS — M545 Low back pain, unspecified: Secondary | ICD-10-CM | POA: Diagnosis not present

## 2020-12-26 DIAGNOSIS — M546 Pain in thoracic spine: Secondary | ICD-10-CM | POA: Diagnosis not present

## 2020-12-26 DIAGNOSIS — M9902 Segmental and somatic dysfunction of thoracic region: Secondary | ICD-10-CM | POA: Diagnosis not present

## 2021-01-01 DIAGNOSIS — M546 Pain in thoracic spine: Secondary | ICD-10-CM | POA: Diagnosis not present

## 2021-01-01 DIAGNOSIS — M62838 Other muscle spasm: Secondary | ICD-10-CM | POA: Diagnosis not present

## 2021-01-01 DIAGNOSIS — M9902 Segmental and somatic dysfunction of thoracic region: Secondary | ICD-10-CM | POA: Diagnosis not present

## 2021-01-01 DIAGNOSIS — M545 Low back pain, unspecified: Secondary | ICD-10-CM | POA: Diagnosis not present

## 2021-03-25 DIAGNOSIS — F419 Anxiety disorder, unspecified: Secondary | ICD-10-CM | POA: Diagnosis not present

## 2021-03-25 DIAGNOSIS — F900 Attention-deficit hyperactivity disorder, predominantly inattentive type: Secondary | ICD-10-CM | POA: Diagnosis not present

## 2021-03-25 DIAGNOSIS — M25511 Pain in right shoulder: Secondary | ICD-10-CM | POA: Diagnosis not present

## 2021-03-28 DIAGNOSIS — M79601 Pain in right arm: Secondary | ICD-10-CM | POA: Diagnosis not present

## 2021-04-15 DIAGNOSIS — M5412 Radiculopathy, cervical region: Secondary | ICD-10-CM | POA: Diagnosis not present

## 2021-04-22 DIAGNOSIS — M5412 Radiculopathy, cervical region: Secondary | ICD-10-CM | POA: Diagnosis not present

## 2021-04-24 DIAGNOSIS — M25511 Pain in right shoulder: Secondary | ICD-10-CM | POA: Diagnosis not present

## 2021-04-25 DIAGNOSIS — M5412 Radiculopathy, cervical region: Secondary | ICD-10-CM | POA: Diagnosis not present

## 2021-04-30 DIAGNOSIS — M5412 Radiculopathy, cervical region: Secondary | ICD-10-CM | POA: Diagnosis not present

## 2021-05-03 DIAGNOSIS — M5412 Radiculopathy, cervical region: Secondary | ICD-10-CM | POA: Diagnosis not present

## 2021-05-06 DIAGNOSIS — M5412 Radiculopathy, cervical region: Secondary | ICD-10-CM | POA: Diagnosis not present

## 2021-05-09 DIAGNOSIS — M5412 Radiculopathy, cervical region: Secondary | ICD-10-CM | POA: Diagnosis not present

## 2021-05-13 DIAGNOSIS — M5412 Radiculopathy, cervical region: Secondary | ICD-10-CM | POA: Diagnosis not present

## 2021-05-16 DIAGNOSIS — M5412 Radiculopathy, cervical region: Secondary | ICD-10-CM | POA: Diagnosis not present

## 2021-05-23 DIAGNOSIS — M25511 Pain in right shoulder: Secondary | ICD-10-CM | POA: Diagnosis not present

## 2021-05-23 DIAGNOSIS — M5412 Radiculopathy, cervical region: Secondary | ICD-10-CM | POA: Diagnosis not present

## 2021-05-24 ENCOUNTER — Ambulatory Visit
Admission: RE | Admit: 2021-05-24 | Discharge: 2021-05-24 | Disposition: A | Discharge status: Home / Self Care | Payer: BC Managed Care – PPO | Source: Ambulatory Visit | Attending: Sports Medicine | Admitting: Sports Medicine

## 2021-05-24 ENCOUNTER — Other Ambulatory Visit: Payer: Self-pay | Admitting: Sports Medicine

## 2021-05-24 DIAGNOSIS — M542 Cervicalgia: Secondary | ICD-10-CM

## 2021-05-24 DIAGNOSIS — M25511 Pain in right shoulder: Secondary | ICD-10-CM | POA: Diagnosis not present

## 2021-05-24 DIAGNOSIS — M79601 Pain in right arm: Secondary | ICD-10-CM

## 2021-05-27 DIAGNOSIS — M5412 Radiculopathy, cervical region: Secondary | ICD-10-CM | POA: Diagnosis not present

## 2021-05-29 DIAGNOSIS — M5412 Radiculopathy, cervical region: Secondary | ICD-10-CM | POA: Diagnosis not present

## 2021-06-04 DIAGNOSIS — M5412 Radiculopathy, cervical region: Secondary | ICD-10-CM | POA: Diagnosis not present

## 2021-06-07 DIAGNOSIS — M5412 Radiculopathy, cervical region: Secondary | ICD-10-CM | POA: Diagnosis not present

## 2021-06-11 DIAGNOSIS — M5412 Radiculopathy, cervical region: Secondary | ICD-10-CM | POA: Diagnosis not present

## 2021-06-14 DIAGNOSIS — M5412 Radiculopathy, cervical region: Secondary | ICD-10-CM | POA: Diagnosis not present

## 2021-06-18 DIAGNOSIS — M9903 Segmental and somatic dysfunction of lumbar region: Secondary | ICD-10-CM | POA: Diagnosis not present

## 2021-06-18 DIAGNOSIS — M9904 Segmental and somatic dysfunction of sacral region: Secondary | ICD-10-CM | POA: Diagnosis not present

## 2021-06-18 DIAGNOSIS — M9901 Segmental and somatic dysfunction of cervical region: Secondary | ICD-10-CM | POA: Diagnosis not present

## 2021-06-18 DIAGNOSIS — M9902 Segmental and somatic dysfunction of thoracic region: Secondary | ICD-10-CM | POA: Diagnosis not present

## 2021-06-25 DIAGNOSIS — M5412 Radiculopathy, cervical region: Secondary | ICD-10-CM | POA: Diagnosis not present

## 2021-06-25 DIAGNOSIS — M9902 Segmental and somatic dysfunction of thoracic region: Secondary | ICD-10-CM | POA: Diagnosis not present

## 2021-06-25 DIAGNOSIS — M9903 Segmental and somatic dysfunction of lumbar region: Secondary | ICD-10-CM | POA: Diagnosis not present

## 2021-06-25 DIAGNOSIS — M9901 Segmental and somatic dysfunction of cervical region: Secondary | ICD-10-CM | POA: Diagnosis not present

## 2021-06-25 DIAGNOSIS — M9904 Segmental and somatic dysfunction of sacral region: Secondary | ICD-10-CM | POA: Diagnosis not present

## 2021-06-26 DIAGNOSIS — B079 Viral wart, unspecified: Secondary | ICD-10-CM | POA: Diagnosis not present

## 2021-06-26 DIAGNOSIS — M79672 Pain in left foot: Secondary | ICD-10-CM | POA: Diagnosis not present

## 2021-06-26 DIAGNOSIS — M79671 Pain in right foot: Secondary | ICD-10-CM | POA: Diagnosis not present

## 2021-07-01 DIAGNOSIS — M9901 Segmental and somatic dysfunction of cervical region: Secondary | ICD-10-CM | POA: Diagnosis not present

## 2021-07-01 DIAGNOSIS — M9904 Segmental and somatic dysfunction of sacral region: Secondary | ICD-10-CM | POA: Diagnosis not present

## 2021-07-01 DIAGNOSIS — M9902 Segmental and somatic dysfunction of thoracic region: Secondary | ICD-10-CM | POA: Diagnosis not present

## 2021-07-01 DIAGNOSIS — M9903 Segmental and somatic dysfunction of lumbar region: Secondary | ICD-10-CM | POA: Diagnosis not present

## 2021-07-02 DIAGNOSIS — M5412 Radiculopathy, cervical region: Secondary | ICD-10-CM | POA: Diagnosis not present

## 2021-07-03 DIAGNOSIS — Z13220 Encounter for screening for lipoid disorders: Secondary | ICD-10-CM | POA: Diagnosis not present

## 2021-07-03 DIAGNOSIS — Z23 Encounter for immunization: Secondary | ICD-10-CM | POA: Diagnosis not present

## 2021-07-03 DIAGNOSIS — F900 Attention-deficit hyperactivity disorder, predominantly inattentive type: Secondary | ICD-10-CM | POA: Diagnosis not present

## 2021-07-03 DIAGNOSIS — Z0000 Encounter for general adult medical examination without abnormal findings: Secondary | ICD-10-CM | POA: Diagnosis not present

## 2021-07-03 DIAGNOSIS — F419 Anxiety disorder, unspecified: Secondary | ICD-10-CM | POA: Diagnosis not present

## 2021-07-03 DIAGNOSIS — Z131 Encounter for screening for diabetes mellitus: Secondary | ICD-10-CM | POA: Diagnosis not present

## 2021-07-05 DIAGNOSIS — M5412 Radiculopathy, cervical region: Secondary | ICD-10-CM | POA: Diagnosis not present

## 2021-07-08 DIAGNOSIS — M9904 Segmental and somatic dysfunction of sacral region: Secondary | ICD-10-CM | POA: Diagnosis not present

## 2021-07-08 DIAGNOSIS — M9901 Segmental and somatic dysfunction of cervical region: Secondary | ICD-10-CM | POA: Diagnosis not present

## 2021-07-08 DIAGNOSIS — M9902 Segmental and somatic dysfunction of thoracic region: Secondary | ICD-10-CM | POA: Diagnosis not present

## 2021-07-08 DIAGNOSIS — M9903 Segmental and somatic dysfunction of lumbar region: Secondary | ICD-10-CM | POA: Diagnosis not present

## 2021-07-09 DIAGNOSIS — M5412 Radiculopathy, cervical region: Secondary | ICD-10-CM | POA: Diagnosis not present

## 2021-07-12 DIAGNOSIS — M5412 Radiculopathy, cervical region: Secondary | ICD-10-CM | POA: Diagnosis not present

## 2021-07-15 DIAGNOSIS — M5412 Radiculopathy, cervical region: Secondary | ICD-10-CM | POA: Diagnosis not present

## 2021-07-15 DIAGNOSIS — B079 Viral wart, unspecified: Secondary | ICD-10-CM | POA: Diagnosis not present

## 2021-07-15 DIAGNOSIS — M21622 Bunionette of left foot: Secondary | ICD-10-CM | POA: Diagnosis not present

## 2021-07-17 DIAGNOSIS — M5412 Radiculopathy, cervical region: Secondary | ICD-10-CM | POA: Diagnosis not present

## 2021-07-22 DIAGNOSIS — M9902 Segmental and somatic dysfunction of thoracic region: Secondary | ICD-10-CM | POA: Diagnosis not present

## 2021-07-22 DIAGNOSIS — M9901 Segmental and somatic dysfunction of cervical region: Secondary | ICD-10-CM | POA: Diagnosis not present

## 2021-07-22 DIAGNOSIS — M9904 Segmental and somatic dysfunction of sacral region: Secondary | ICD-10-CM | POA: Diagnosis not present

## 2021-07-22 DIAGNOSIS — M9903 Segmental and somatic dysfunction of lumbar region: Secondary | ICD-10-CM | POA: Diagnosis not present

## 2021-07-23 DIAGNOSIS — M5412 Radiculopathy, cervical region: Secondary | ICD-10-CM | POA: Diagnosis not present

## 2021-07-24 DIAGNOSIS — M25511 Pain in right shoulder: Secondary | ICD-10-CM | POA: Diagnosis not present

## 2021-07-26 DIAGNOSIS — M5412 Radiculopathy, cervical region: Secondary | ICD-10-CM | POA: Diagnosis not present

## 2021-07-26 DIAGNOSIS — B079 Viral wart, unspecified: Secondary | ICD-10-CM | POA: Diagnosis not present

## 2021-07-30 DIAGNOSIS — M9902 Segmental and somatic dysfunction of thoracic region: Secondary | ICD-10-CM | POA: Diagnosis not present

## 2021-07-30 DIAGNOSIS — M9904 Segmental and somatic dysfunction of sacral region: Secondary | ICD-10-CM | POA: Diagnosis not present

## 2021-07-30 DIAGNOSIS — M5412 Radiculopathy, cervical region: Secondary | ICD-10-CM | POA: Diagnosis not present

## 2021-07-30 DIAGNOSIS — M9901 Segmental and somatic dysfunction of cervical region: Secondary | ICD-10-CM | POA: Diagnosis not present

## 2021-07-30 DIAGNOSIS — M9903 Segmental and somatic dysfunction of lumbar region: Secondary | ICD-10-CM | POA: Diagnosis not present

## 2021-07-31 DIAGNOSIS — F900 Attention-deficit hyperactivity disorder, predominantly inattentive type: Secondary | ICD-10-CM | POA: Diagnosis not present

## 2021-07-31 DIAGNOSIS — F419 Anxiety disorder, unspecified: Secondary | ICD-10-CM | POA: Diagnosis not present

## 2021-08-02 DIAGNOSIS — M5412 Radiculopathy, cervical region: Secondary | ICD-10-CM | POA: Diagnosis not present

## 2021-08-05 DIAGNOSIS — M9901 Segmental and somatic dysfunction of cervical region: Secondary | ICD-10-CM | POA: Diagnosis not present

## 2021-08-05 DIAGNOSIS — M9902 Segmental and somatic dysfunction of thoracic region: Secondary | ICD-10-CM | POA: Diagnosis not present

## 2021-08-05 DIAGNOSIS — M9903 Segmental and somatic dysfunction of lumbar region: Secondary | ICD-10-CM | POA: Diagnosis not present

## 2021-08-05 DIAGNOSIS — M9904 Segmental and somatic dysfunction of sacral region: Secondary | ICD-10-CM | POA: Diagnosis not present

## 2021-08-06 DIAGNOSIS — M5412 Radiculopathy, cervical region: Secondary | ICD-10-CM | POA: Diagnosis not present

## 2021-08-09 DIAGNOSIS — M5412 Radiculopathy, cervical region: Secondary | ICD-10-CM | POA: Diagnosis not present

## 2021-08-13 DIAGNOSIS — M5412 Radiculopathy, cervical region: Secondary | ICD-10-CM | POA: Diagnosis not present

## 2021-08-14 DIAGNOSIS — M25511 Pain in right shoulder: Secondary | ICD-10-CM | POA: Diagnosis not present

## 2021-08-21 DIAGNOSIS — N898 Other specified noninflammatory disorders of vagina: Secondary | ICD-10-CM | POA: Diagnosis not present

## 2021-08-21 DIAGNOSIS — N939 Abnormal uterine and vaginal bleeding, unspecified: Secondary | ICD-10-CM | POA: Diagnosis not present

## 2021-08-21 DIAGNOSIS — N926 Irregular menstruation, unspecified: Secondary | ICD-10-CM | POA: Diagnosis not present

## 2021-09-03 DIAGNOSIS — N939 Abnormal uterine and vaginal bleeding, unspecified: Secondary | ICD-10-CM | POA: Diagnosis not present

## 2021-09-27 DIAGNOSIS — F419 Anxiety disorder, unspecified: Secondary | ICD-10-CM | POA: Diagnosis not present

## 2021-09-27 DIAGNOSIS — F900 Attention-deficit hyperactivity disorder, predominantly inattentive type: Secondary | ICD-10-CM | POA: Diagnosis not present

## 2021-11-20 DIAGNOSIS — F900 Attention-deficit hyperactivity disorder, predominantly inattentive type: Secondary | ICD-10-CM | POA: Diagnosis not present

## 2021-11-20 DIAGNOSIS — F419 Anxiety disorder, unspecified: Secondary | ICD-10-CM | POA: Diagnosis not present

## 2022-01-29 DIAGNOSIS — F900 Attention-deficit hyperactivity disorder, predominantly inattentive type: Secondary | ICD-10-CM | POA: Diagnosis not present

## 2022-01-29 DIAGNOSIS — F419 Anxiety disorder, unspecified: Secondary | ICD-10-CM | POA: Diagnosis not present

## 2022-02-04 ENCOUNTER — Other Ambulatory Visit: Payer: Self-pay | Admitting: Nurse Practitioner

## 2022-02-04 DIAGNOSIS — N6310 Unspecified lump in the right breast, unspecified quadrant: Secondary | ICD-10-CM

## 2022-02-11 ENCOUNTER — Ambulatory Visit
Admission: RE | Admit: 2022-02-11 | Discharge: 2022-02-11 | Disposition: A | Discharge status: Home / Self Care | Payer: BC Managed Care – PPO | Source: Ambulatory Visit | Attending: Nurse Practitioner | Admitting: Nurse Practitioner

## 2022-02-11 ENCOUNTER — Other Ambulatory Visit: Payer: Self-pay | Admitting: Nurse Practitioner

## 2022-02-11 DIAGNOSIS — N6310 Unspecified lump in the right breast, unspecified quadrant: Secondary | ICD-10-CM

## 2022-02-11 DIAGNOSIS — N6314 Unspecified lump in the right breast, lower inner quadrant: Secondary | ICD-10-CM | POA: Diagnosis not present

## 2022-02-11 DIAGNOSIS — N6011 Diffuse cystic mastopathy of right breast: Secondary | ICD-10-CM | POA: Diagnosis not present

## 2022-02-11 DIAGNOSIS — R922 Inconclusive mammogram: Secondary | ICD-10-CM | POA: Diagnosis not present

## 2022-02-11 HISTORY — PX: BREAST BIOPSY: SHX20

## 2022-05-09 DIAGNOSIS — Z23 Encounter for immunization: Secondary | ICD-10-CM | POA: Diagnosis not present

## 2022-05-09 DIAGNOSIS — F900 Attention-deficit hyperactivity disorder, predominantly inattentive type: Secondary | ICD-10-CM | POA: Diagnosis not present

## 2022-05-09 DIAGNOSIS — F419 Anxiety disorder, unspecified: Secondary | ICD-10-CM | POA: Diagnosis not present

## 2022-06-18 DIAGNOSIS — M9904 Segmental and somatic dysfunction of sacral region: Secondary | ICD-10-CM | POA: Diagnosis not present

## 2022-06-18 DIAGNOSIS — M9903 Segmental and somatic dysfunction of lumbar region: Secondary | ICD-10-CM | POA: Diagnosis not present

## 2022-06-18 DIAGNOSIS — M9901 Segmental and somatic dysfunction of cervical region: Secondary | ICD-10-CM | POA: Diagnosis not present

## 2022-06-18 DIAGNOSIS — M9902 Segmental and somatic dysfunction of thoracic region: Secondary | ICD-10-CM | POA: Diagnosis not present

## 2022-06-25 DIAGNOSIS — F411 Generalized anxiety disorder: Secondary | ICD-10-CM | POA: Diagnosis not present

## 2022-07-04 DIAGNOSIS — Z13220 Encounter for screening for lipoid disorders: Secondary | ICD-10-CM | POA: Diagnosis not present

## 2022-07-04 DIAGNOSIS — F900 Attention-deficit hyperactivity disorder, predominantly inattentive type: Secondary | ICD-10-CM | POA: Diagnosis not present

## 2022-07-04 DIAGNOSIS — F419 Anxiety disorder, unspecified: Secondary | ICD-10-CM | POA: Diagnosis not present

## 2022-07-04 DIAGNOSIS — Z0000 Encounter for general adult medical examination without abnormal findings: Secondary | ICD-10-CM | POA: Diagnosis not present

## 2022-07-08 DIAGNOSIS — M542 Cervicalgia: Secondary | ICD-10-CM | POA: Diagnosis not present

## 2022-07-10 DIAGNOSIS — M9903 Segmental and somatic dysfunction of lumbar region: Secondary | ICD-10-CM | POA: Diagnosis not present

## 2022-07-10 DIAGNOSIS — M9904 Segmental and somatic dysfunction of sacral region: Secondary | ICD-10-CM | POA: Diagnosis not present

## 2022-07-10 DIAGNOSIS — M9901 Segmental and somatic dysfunction of cervical region: Secondary | ICD-10-CM | POA: Diagnosis not present

## 2022-07-10 DIAGNOSIS — M9902 Segmental and somatic dysfunction of thoracic region: Secondary | ICD-10-CM | POA: Diagnosis not present

## 2022-07-14 DIAGNOSIS — F411 Generalized anxiety disorder: Secondary | ICD-10-CM | POA: Diagnosis not present

## 2022-07-23 DIAGNOSIS — F411 Generalized anxiety disorder: Secondary | ICD-10-CM | POA: Diagnosis not present

## 2022-07-28 DIAGNOSIS — F411 Generalized anxiety disorder: Secondary | ICD-10-CM | POA: Diagnosis not present

## 2022-07-29 ENCOUNTER — Other Ambulatory Visit: Payer: Self-pay | Admitting: Sports Medicine

## 2022-07-29 DIAGNOSIS — M542 Cervicalgia: Secondary | ICD-10-CM

## 2022-07-30 DIAGNOSIS — M9903 Segmental and somatic dysfunction of lumbar region: Secondary | ICD-10-CM | POA: Diagnosis not present

## 2022-07-30 DIAGNOSIS — M9902 Segmental and somatic dysfunction of thoracic region: Secondary | ICD-10-CM | POA: Diagnosis not present

## 2022-07-30 DIAGNOSIS — M9901 Segmental and somatic dysfunction of cervical region: Secondary | ICD-10-CM | POA: Diagnosis not present

## 2022-07-30 DIAGNOSIS — M9904 Segmental and somatic dysfunction of sacral region: Secondary | ICD-10-CM | POA: Diagnosis not present

## 2022-08-11 ENCOUNTER — Ambulatory Visit
Admission: RE | Admit: 2022-08-11 | Discharge: 2022-08-11 | Disposition: A | Discharge status: Home / Self Care | Payer: BC Managed Care – PPO | Source: Ambulatory Visit | Attending: Sports Medicine | Admitting: Sports Medicine

## 2022-08-11 DIAGNOSIS — M542 Cervicalgia: Secondary | ICD-10-CM

## 2022-08-14 ENCOUNTER — Other Ambulatory Visit: Payer: BC Managed Care – PPO

## 2022-11-12 ENCOUNTER — Encounter: Payer: Self-pay | Admitting: Adult Health

## 2022-11-12 ENCOUNTER — Ambulatory Visit: Payer: BC Managed Care – PPO | Admitting: Adult Health

## 2022-11-12 VITALS — BP 110/70 | HR 83 | Ht 67.0 in | Wt 142.0 lb

## 2022-11-12 DIAGNOSIS — F331 Major depressive disorder, recurrent, moderate: Secondary | ICD-10-CM

## 2022-11-12 DIAGNOSIS — F411 Generalized anxiety disorder: Secondary | ICD-10-CM | POA: Diagnosis not present

## 2022-11-12 DIAGNOSIS — F902 Attention-deficit hyperactivity disorder, combined type: Secondary | ICD-10-CM

## 2022-11-12 DIAGNOSIS — F4310 Post-traumatic stress disorder, unspecified: Secondary | ICD-10-CM

## 2022-11-12 NOTE — Progress Notes (Signed)
[facility] MD/PA/NP Initial Note  11/12/2022 3:20 PM Mary Frye  MRN:  [id number]  Chief Complaint:   HPI:   Patient seen today for initial psychiatric evaluation.   Previously seen by PCP for medication management.  Describes mood today as "ok". Pleasant. Denies tearfulness. Mood symptoms - reports depression, anxiety, and irritability. Reports worry, rumination, and over thinking. Denies obsessive thoughts and acts. Mood is variable. Feels like she has been struggling emotionally. Reports struggles in her marriage. Reports a history of alcohol use/abuse. Has recently stopped alcohol use . Has been working with PCP to help manage mood symptoms. Varying interest and motivation. Taking medications as prescribed.  Energy levels lower - "always exhausted". Active, exercising some.   Enjoys some usual interests and activities. Married x 8 and 1/2 years. Has 3 children - boys. Spending time with family. Involved in church. Appetite adequate. Weight stable. Sleeps well most nights. Averages 6 hours. Focus and concentration stable. Completing tasks. Managing aspects of household. 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 Denies SI or HI.  Denies AH or VH. Denies self harm. Denies substance use.  Head injury at age 42 - horseback riding.  Previous medication trials: Celexa  Visit Diagnosis: No diagnosis found.  Past Psychiatric History: Denies psychiatric hospitalization.   Past Medical History:  Past Medical History:  Diagnosis Date   History of back injury    horse injury trt with physical therapy   History of concussion    years ago horse injury   Hx of varicella    Supraumbilical hernia    small   Vaginal Pap smear, abnormal     Past Surgical History:  Procedure Laterality Date   BREAST BIOPSY Right 02/11/2022   CESAREAN SECTION N/A 12/17/2015   Procedure: CESAREAN SECTION;  Surgeon: Everlene Farrier, MD;   Location: [facility];  Service: Obstetrics;  Laterality: N/A;   CESAREAN SECTION N/A 11/12/2017   Procedure: REPEAT CESAREAN SECTION;  Surgeon: Marylynn Pearson, MD;  Location: [facility];  Service: Obstetrics;  Laterality: N/A;  REPEAT EDC 11/18/17 [facility]KA NEED RNFA   CESAREAN SECTION WITH BILATERAL TUBAL LIGATION N/A 02/16/2020   Procedure: CESAREAN SECTION WITH BILATERAL TUBAL LIGATION;  Surgeon: Linda Hedges, DO;  Location: [facility];  Service: Obstetrics;  Laterality: N/A;   dental implants     42 yo   FOOT SURGERY     LEEP      Family Psychiatric History: Denies any family history of mental illness.   Family History:  Family History  Problem Relation Age of Onset   Leukemia Father 58   Breast cancer Maternal Aunt        63s   Seizures Maternal Aunt    Breast cancer Maternal Aunt        40s    Social History:  Social History   Socioeconomic History   Marital status: Married    Spouse name: Not on file   Number of children: Not on file   Years of education: Not on file   Highest education level: Not on file  Occupational History   Not on file  Tobacco Use   Smoking status: Never   Smokeless tobacco: Never  Vaping Use   Vaping Use: Never used  Substance and Sexual Activity   Alcohol use: No   Drug use: No   Sexual activity: Not on file  Other Topics Concern   Not on file  Social History Narrative   Not on file   Social Determinants of Health   Financial Resource Strain: Not on file  Food Insecurity: Not on file  Transportation Needs: Not on file  Physical Activity: Not on file  Stress: Not on file  Social Connections: Not on file    Allergies:  Allergies  Allergen Reactions   Celebrex [Celecoxib] Hives    Metabolic Disorder Labs: No results found for: "HGBA1C", "MPG" No results found for: "PROLACTIN" No results found for: "CHOL", "TRIG", "HDL", "CHOLHDL", "VLDL", "LDLCALC" No results found for: "TSH"  Therapeutic Level Labs: No results  found for: "LITHIUM" No results found for: "VALPROATE" No results found for: "CBMZ"  Current Medications: Current Outpatient Medications  Medication Sig Dispense Refill   amphetamine-dextroamphetamine (ADDERALL) 20 MG tablet 1 tablet     Prenatal Vit-Fe Fumarate-FA (PRENATAL MULTIVITAMIN) TABS tablet Take 1 tablet by mouth daily.      sertraline (ZOLOFT) 50 MG tablet Take 50 mg by mouth daily.     Current Facility-Administered Medications  Medication Dose Route Frequency Provider Last Rate Last Admin   diclofenac Sodium (VOLTAREN) 1 % topical gel 4 g  4 g Topical QID PRN Cindra Presume, MD        Medication Side Effects: none  Orders placed this visit:  No orders of the defined types were placed in this encounter.   Psychiatric Specialty Exam:  Review of Systems  Musculoskeletal:  Negative for gait problem.  Neurological:  Negative for tremors.  Psychiatric/Behavioral:         Please refer to HPI    There were no vitals taken for this visit.There is no height or weight on file to calculate BMI.  General Appearance: Casual and Neat  Eye Contact:  Good  Speech:  Clear and Coherent and Normal Rate  Volume:  Normal  Mood:  Euthymic  Affect:  Appropriate and Congruent  Thought Process:  Coherent and Descriptions of Associations: Intact  Orientation:  Full (Time, Place, and Person)  Thought Content: Logical   Suicidal Thoughts:  No  Homicidal Thoughts:  No  Memory:  WNL  Judgement:  Good  Insight:  Good  Psychomotor Activity:  Normal  Concentration:  Concentration: Good and Attention Span: Good  Recall:  Good  Fund of Knowledge: Good  Language: Good  Assets:  Communication Skills Desire for Improvement Financial Resources/Insurance Housing Intimacy Leisure Time Physical Health Resilience Social Support Talents/Skills Transportation Vocational/Educational  ADL's:  Intact  Cognition: WNL  Prognosis:  Good   Screenings: MDQ  Receiving Psychotherapy: No    Treatment Plan/Recommendations:   Plan:  PDMP reviewed  Zoloft 50mg  daily - previous dose of 100mg  Wellbutrin XL 150mg  daily - previous dose of 300mg  Adderall 20mg  daily - uses as needed. Concerta 54mg  daily  Discussed integrative medication options.  Will request medical records from PCP  RTC as needed  Patient advised to contact office with any questions, adverse effects, or acute worsening in signs and symptoms.   Aloha Gell, NP

## 2023-01-15 IMAGING — CR DG SHOULDER 2+V*R*
3 series · 3 of 3 positions shown · non-contrast
Comparison: None.

CLINICAL DATA: Neck pain radiating into the right shoulder, initial
encounter

EXAM:
RIGHT SHOULDER - 2+ VIEW

[w shoulder ap internal righ]
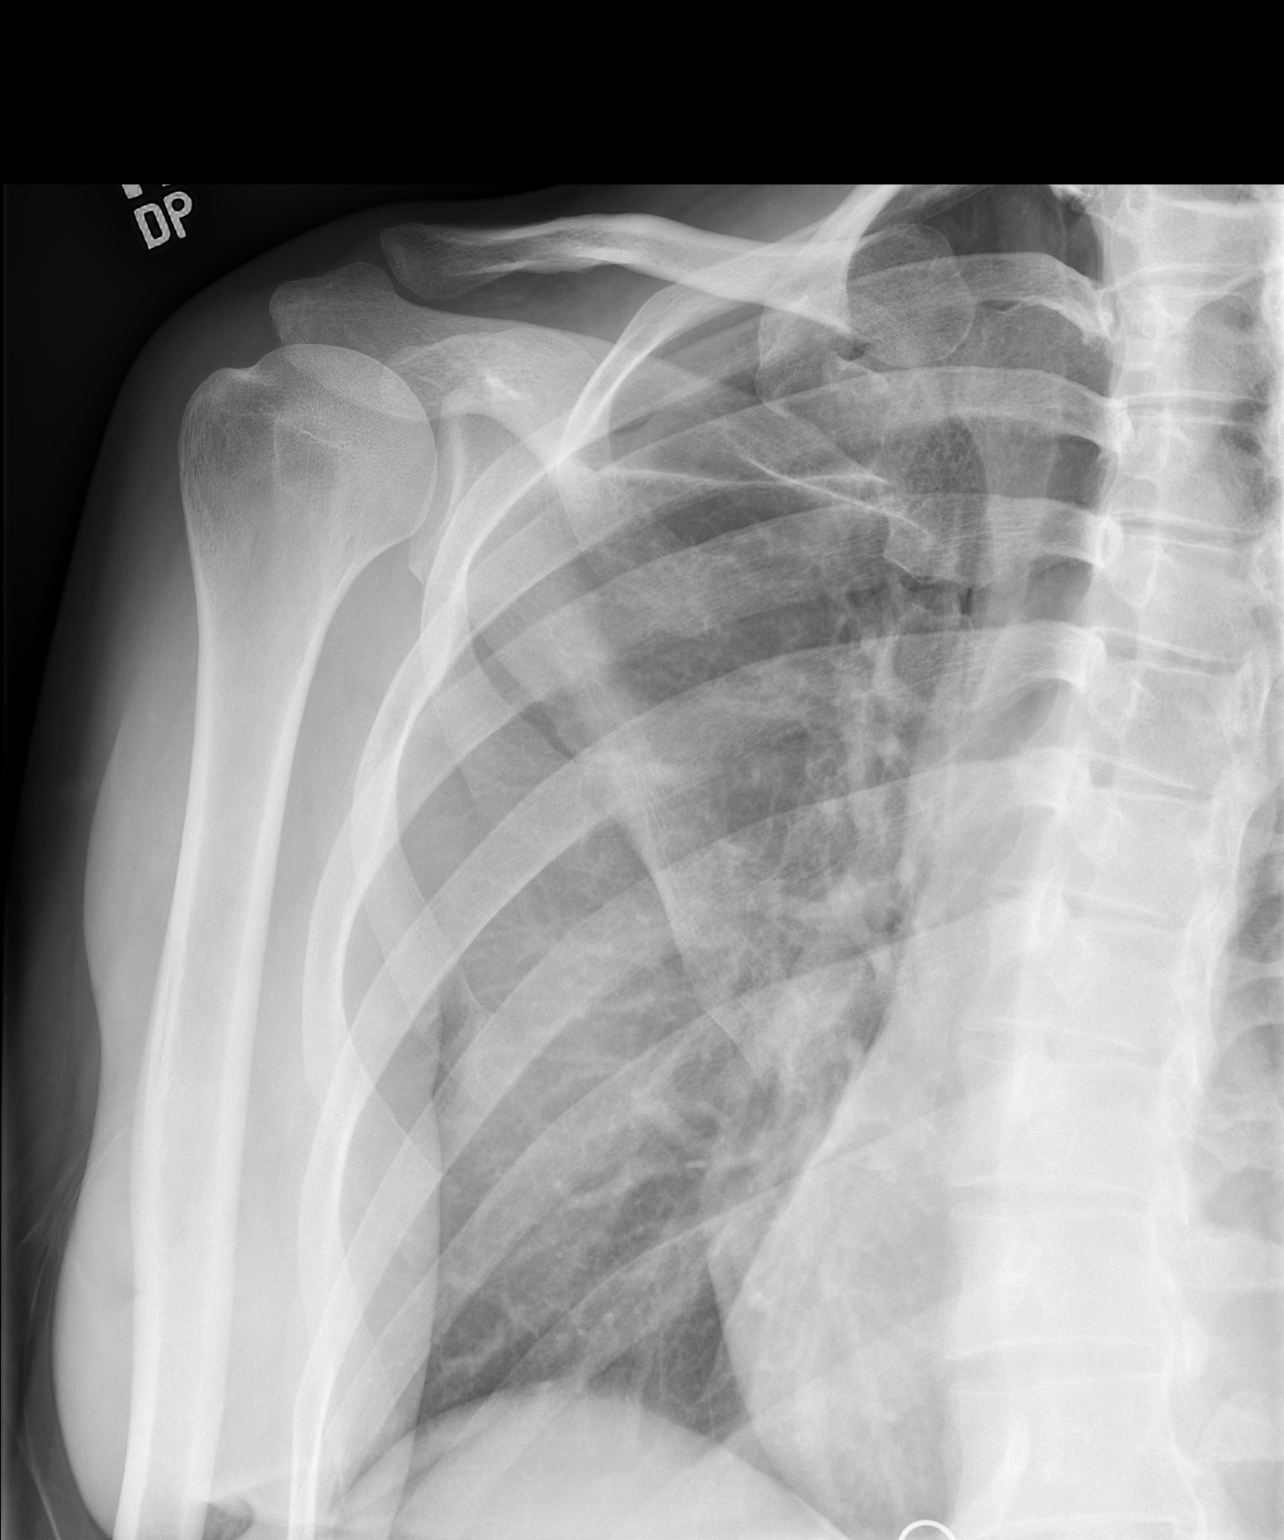

[w shoulder y view right]
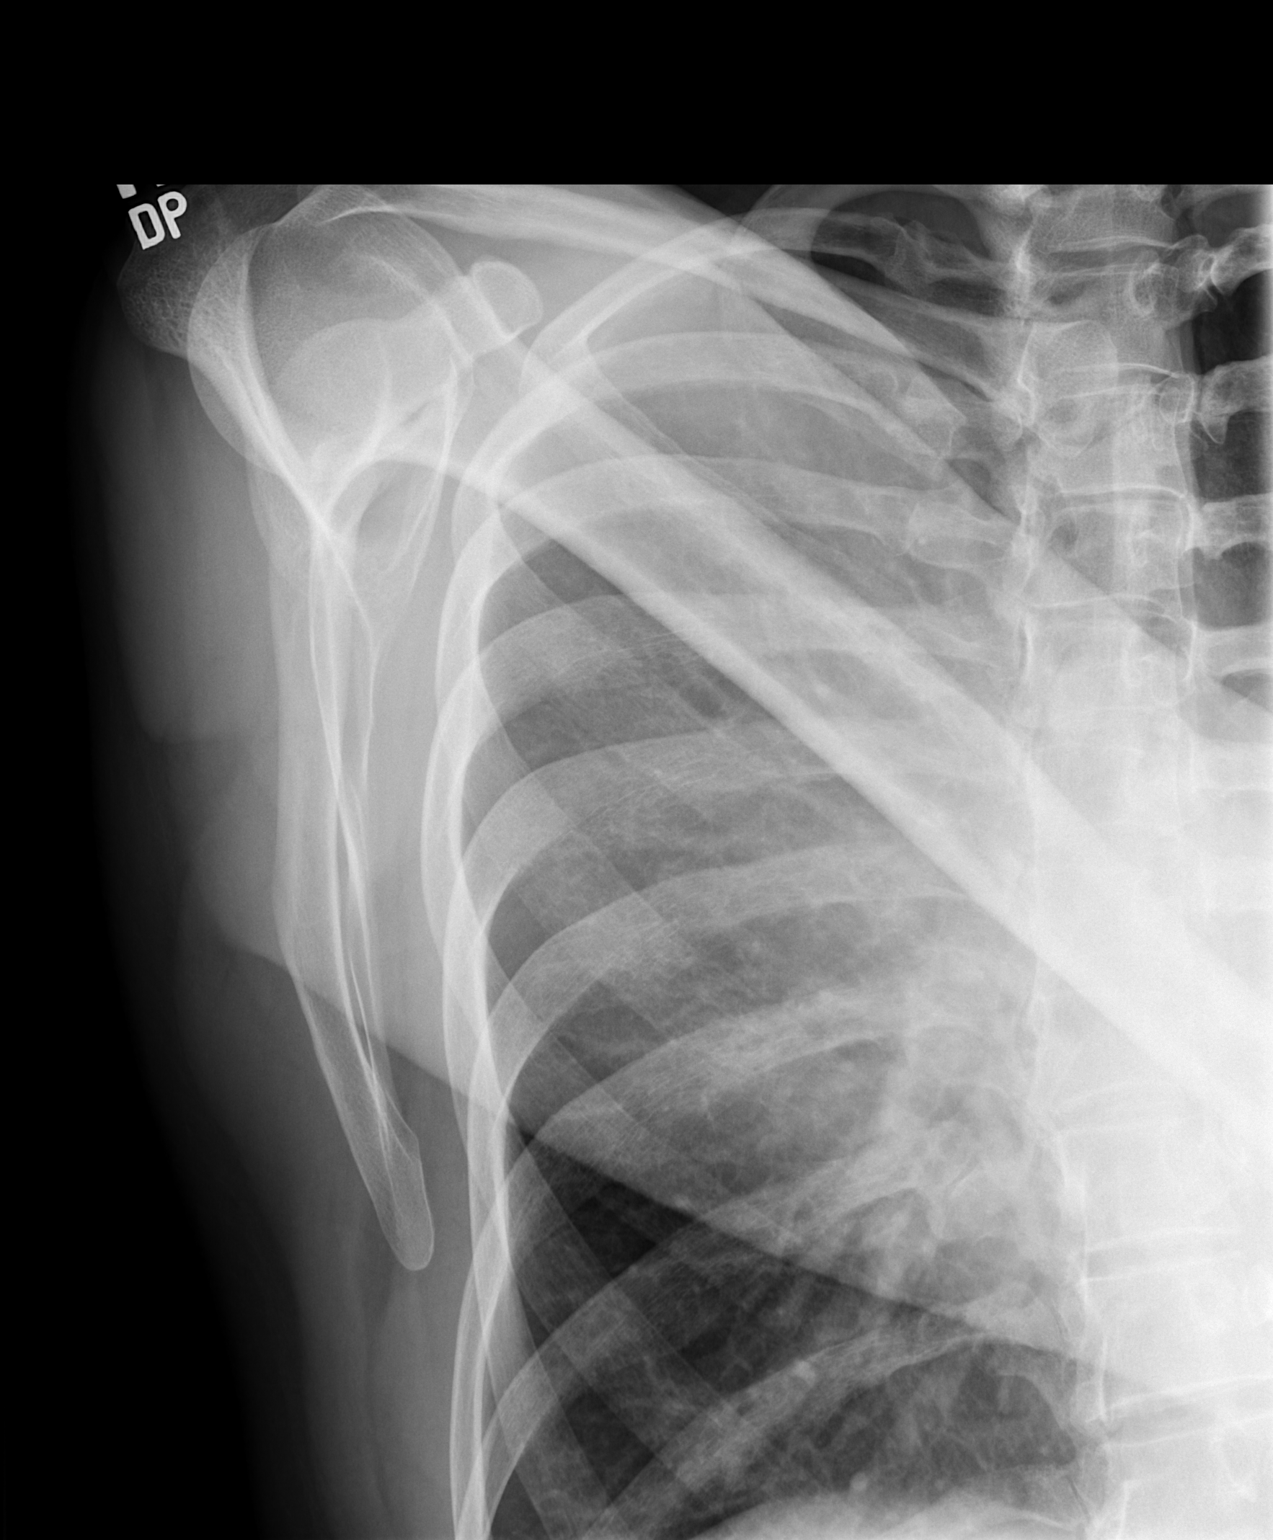

[w shoulder axillary right *]
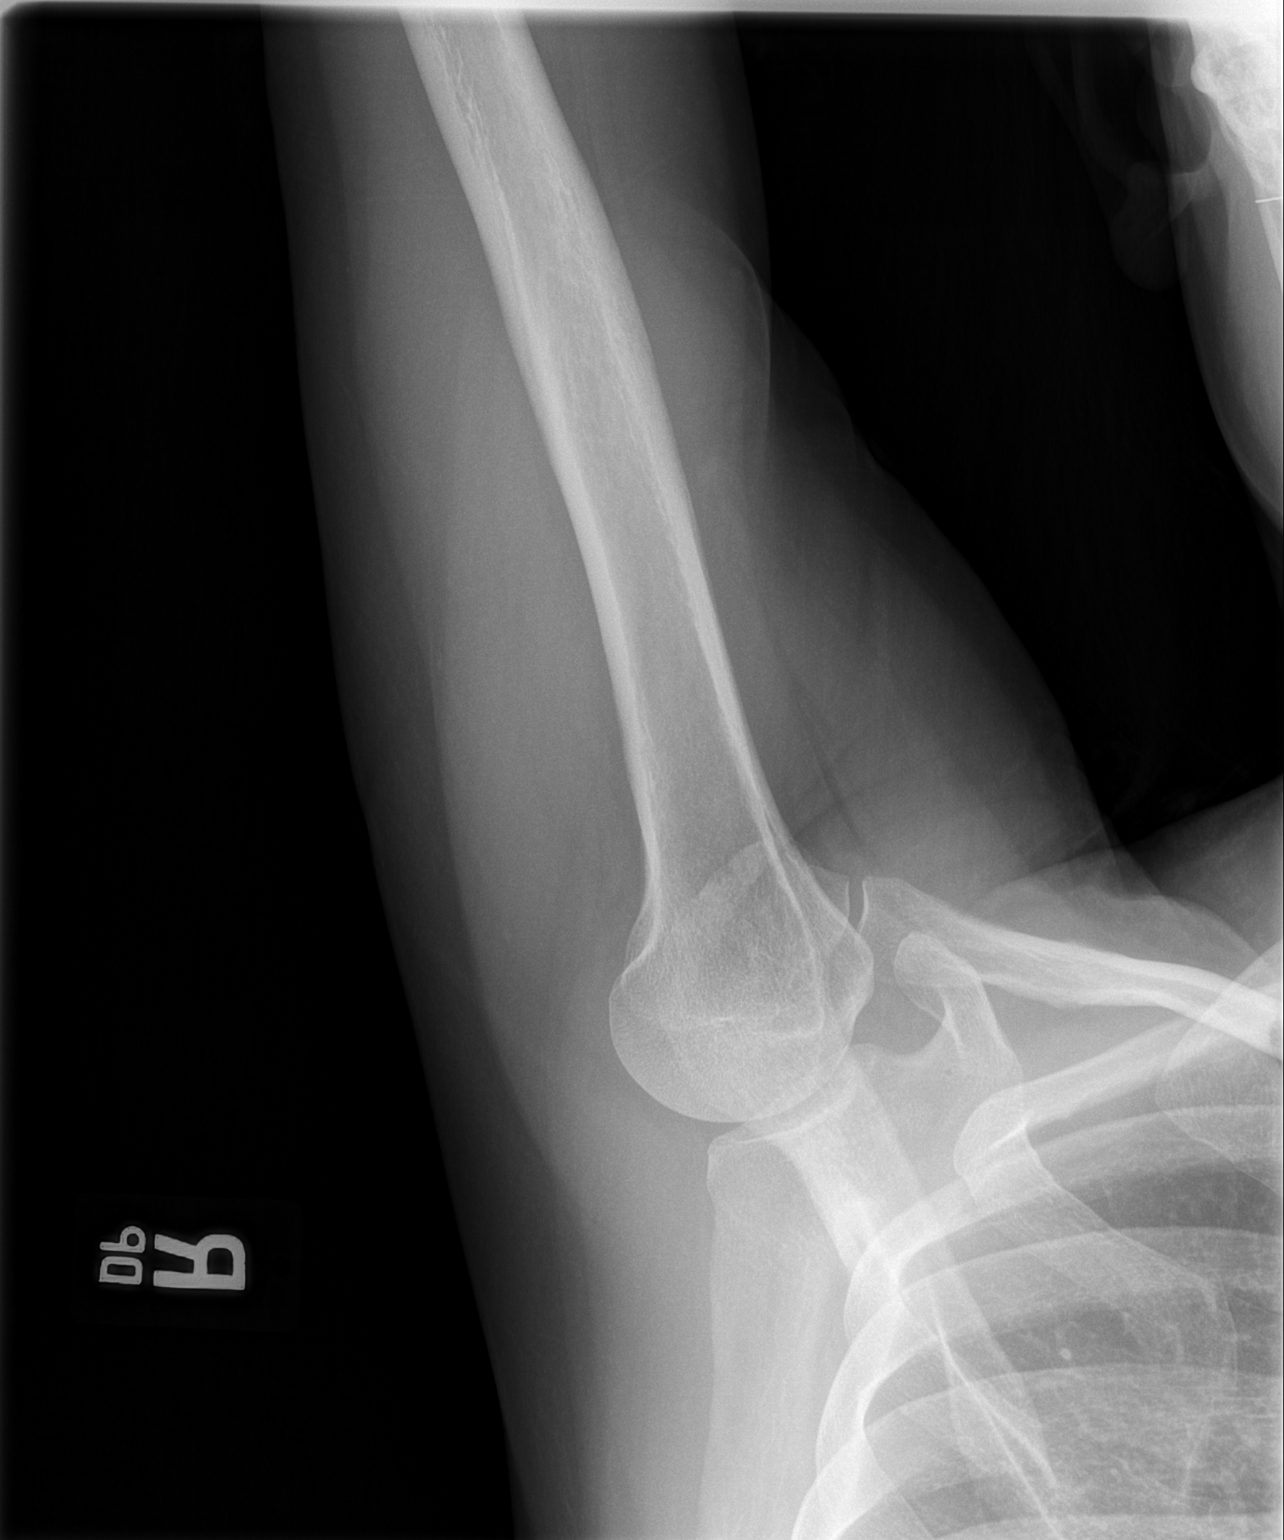

[3 of 3 positions shown; findings below may reference images not displayed]

FINDINGS: There is no evidence of fracture or dislocation. There is no
evidence of arthropathy or other focal bone abnormality. Soft
tissues are unremarkable.
IMPRESSION: No acute abnormality noted.

## 2023-01-15 IMAGING — CR DG CERVICAL SPINE 2 OR 3 VIEWS
3 series · 3 of 3 positions shown · non-contrast
Comparison: None.

CLINICAL DATA: Neck pain radiating into the right shoulder, initial
encounter

EXAM:
CERVICAL SPINE - 2-3 VIEW

[w c-spine lat]
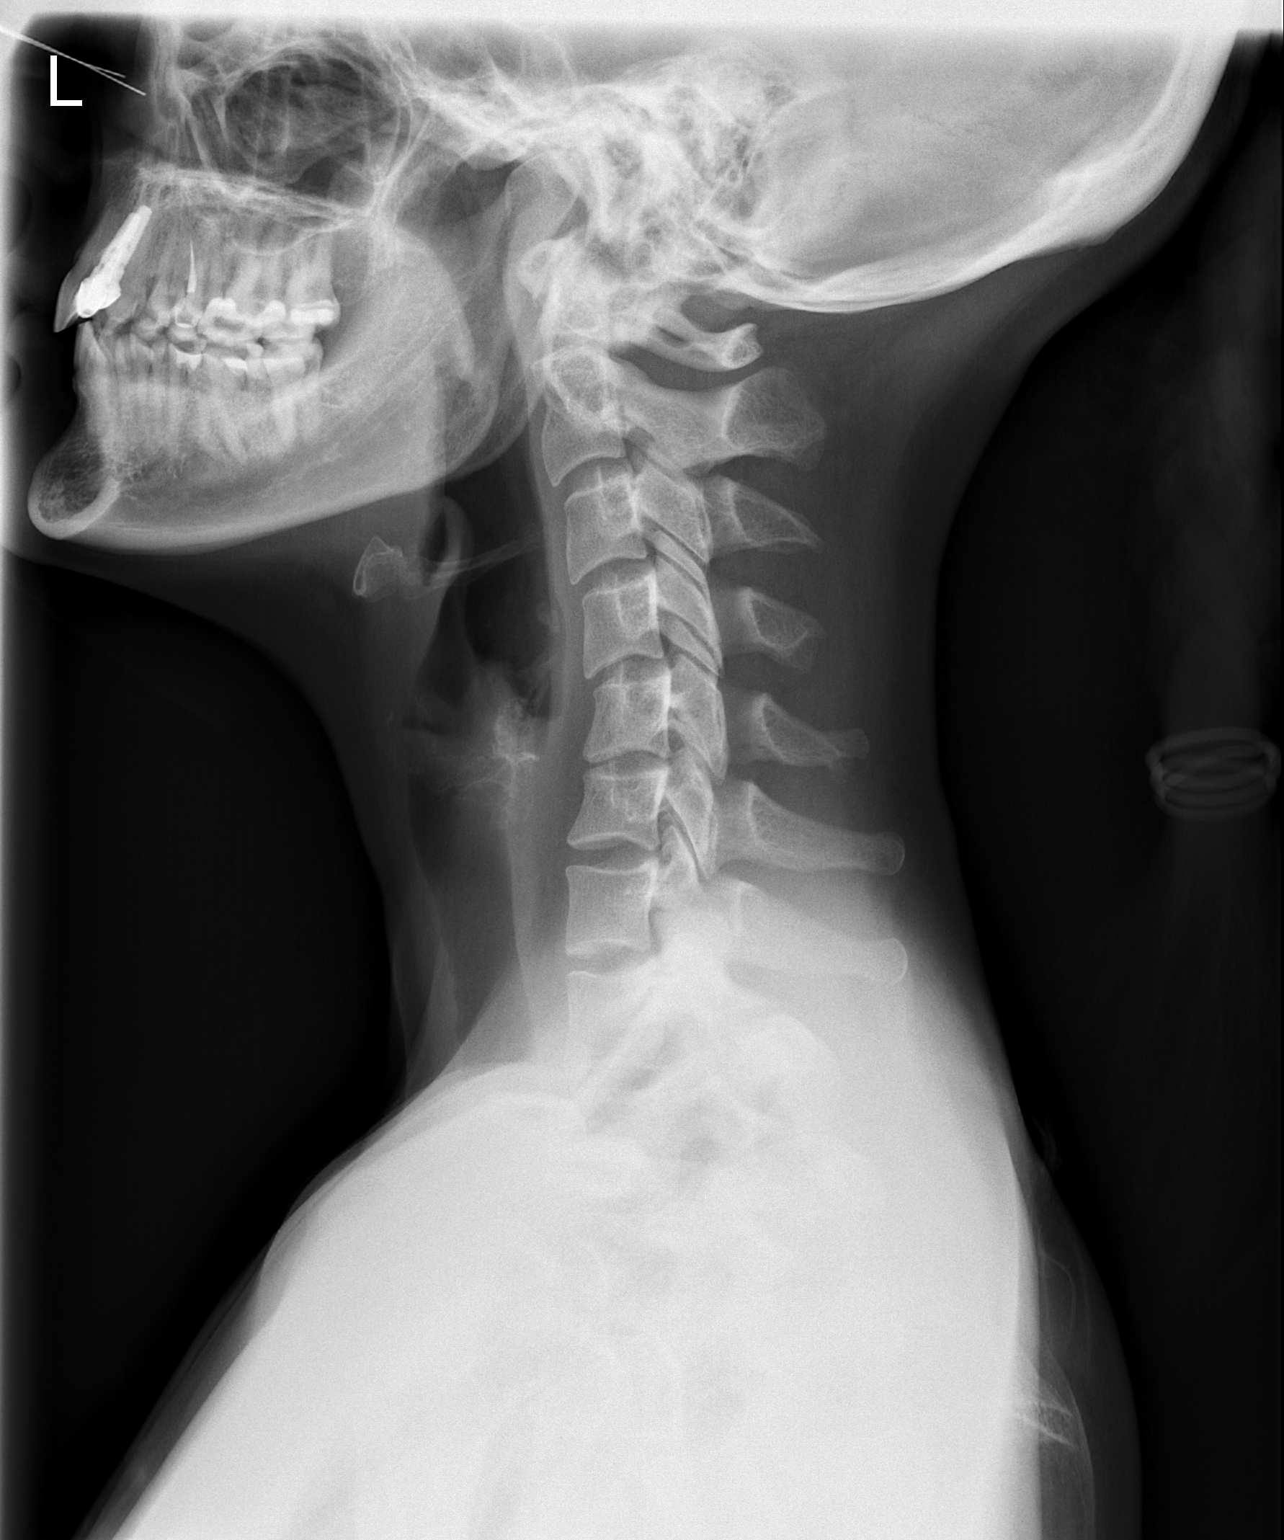

[w c-spine a.p.]
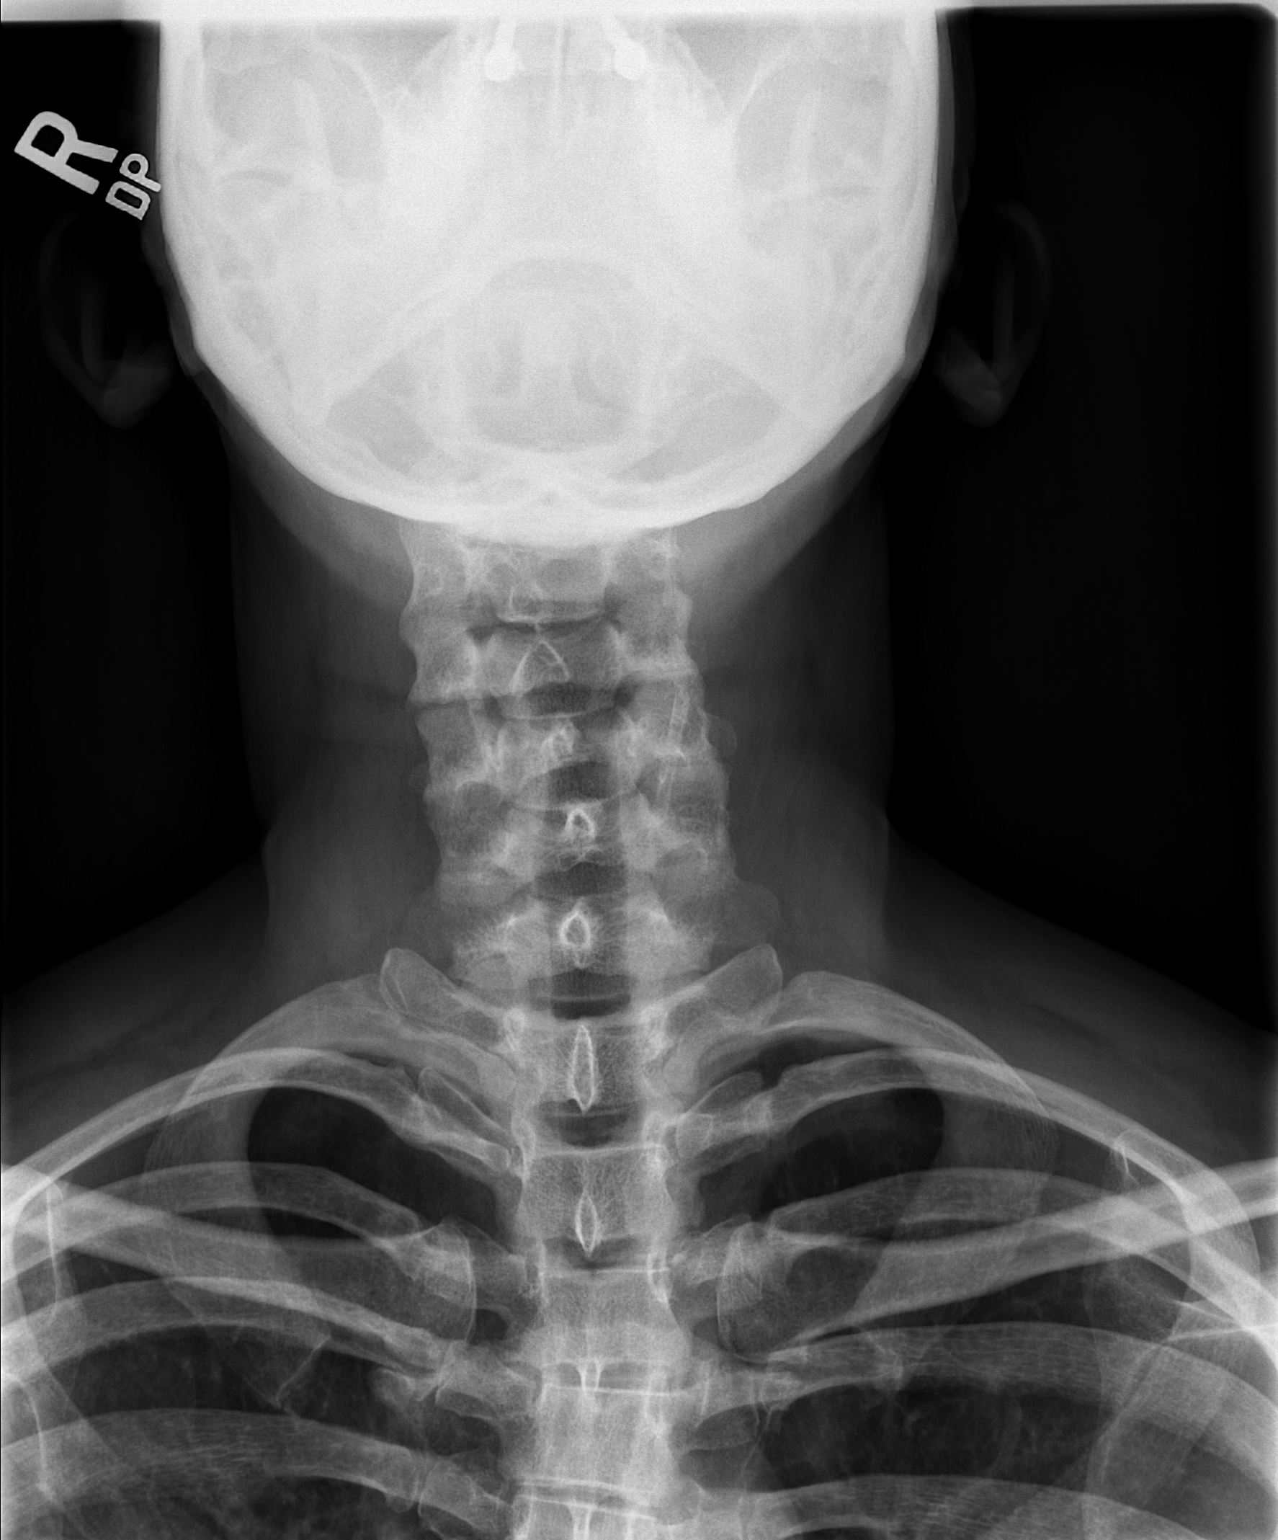

[w c-spine odontoid]
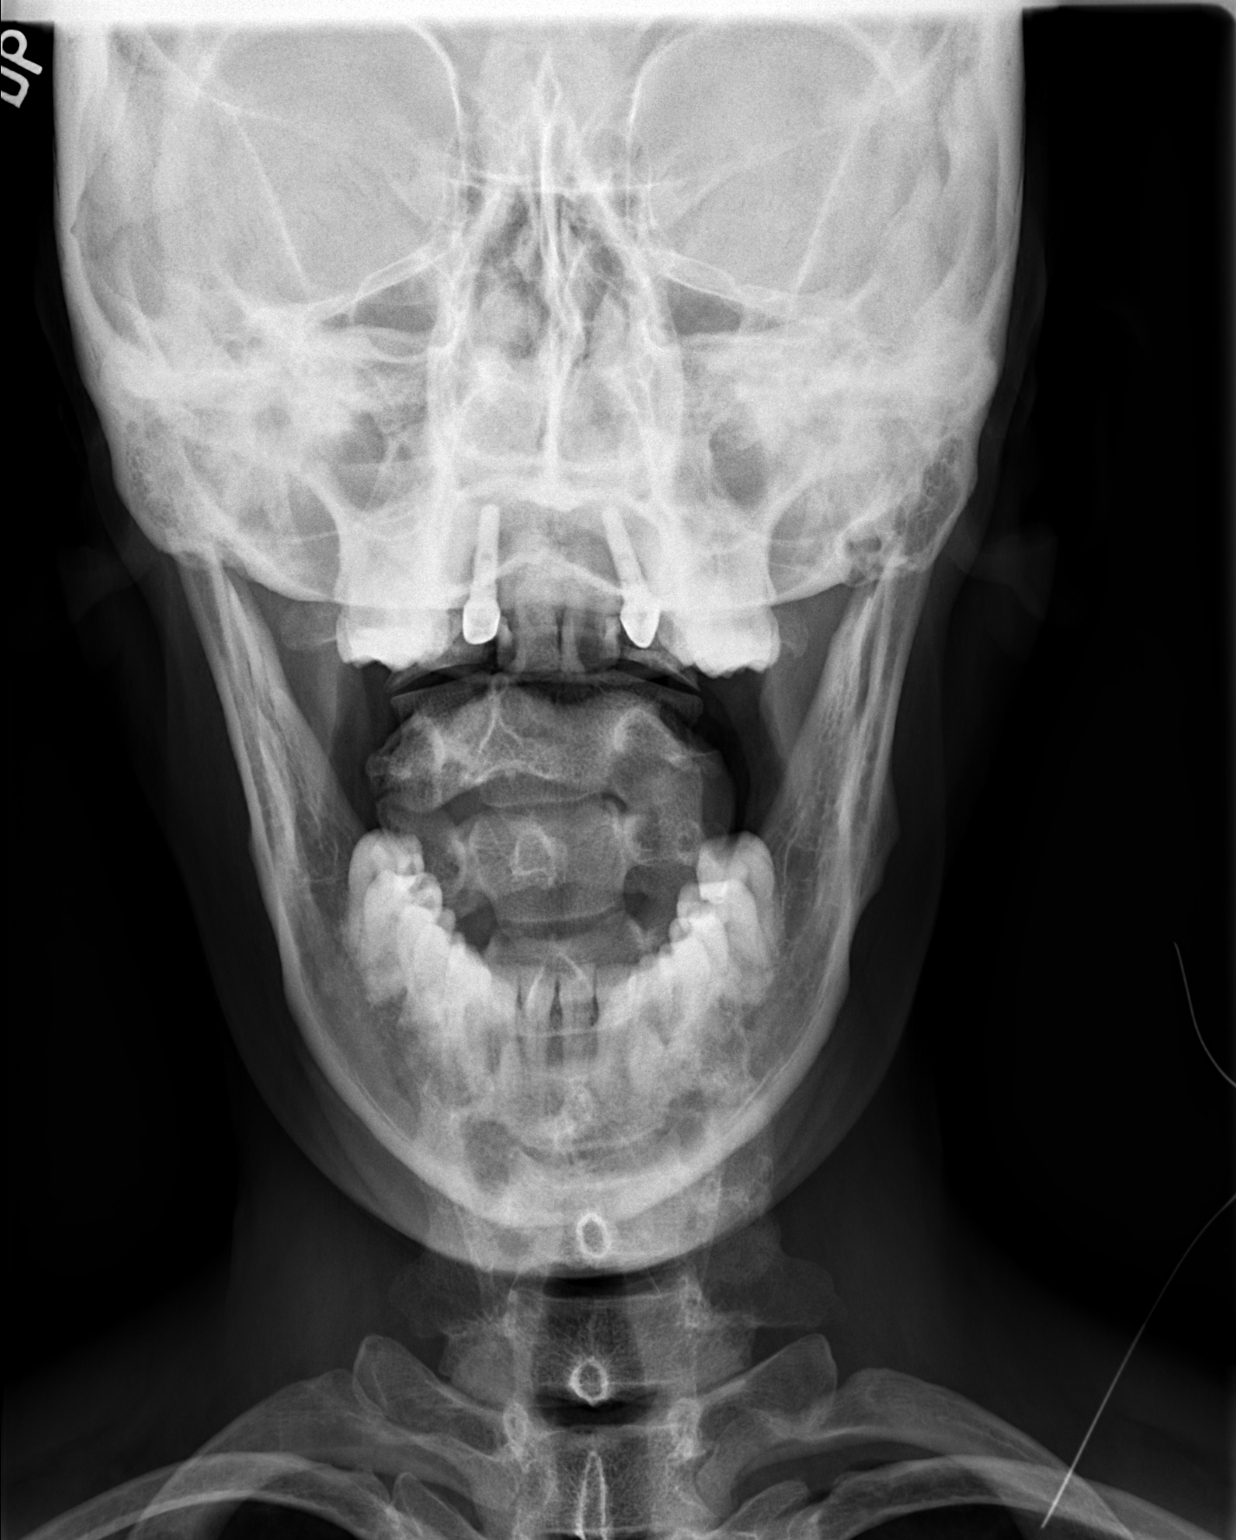

[3 of 3 positions shown; findings below may reference images not displayed]

FINDINGS: Seven cervical segments are well visualized. Loss of the normal
cervical lordosis is noted which may be related to muscular spasm.
No anterolisthesis is seen. No soft tissue abnormality is noted. The
odontoid is within normal limits. No abnormality in the upper chest
is seen.
IMPRESSION: Loss of the normal cervical lordosis. No other focal abnormality is
seen.

## 2023-05-13 ENCOUNTER — Other Ambulatory Visit: Payer: Self-pay | Admitting: Obstetrics & Gynecology

## 2023-05-13 DIAGNOSIS — Z0000 Encounter for general adult medical examination without abnormal findings: Secondary | ICD-10-CM

## 2023-05-18 ENCOUNTER — Ambulatory Visit
Admission: RE | Admit: 2023-05-18 | Discharge: 2023-05-18 | Disposition: A | Discharge status: Home / Self Care | Payer: BC Managed Care – PPO | Source: Ambulatory Visit | Attending: Obstetrics & Gynecology | Admitting: Obstetrics & Gynecology

## 2023-05-18 DIAGNOSIS — Z0000 Encounter for general adult medical examination without abnormal findings: Secondary | ICD-10-CM

## 2023-06-17 ENCOUNTER — Encounter (HOSPITAL_BASED_OUTPATIENT_CLINIC_OR_DEPARTMENT_OTHER): Payer: Self-pay | Admitting: *Deleted

## 2023-06-17 ENCOUNTER — Emergency Department (HOSPITAL_BASED_OUTPATIENT_CLINIC_OR_DEPARTMENT_OTHER): Payer: BC Managed Care – PPO

## 2023-06-17 ENCOUNTER — Other Ambulatory Visit: Payer: Self-pay

## 2023-06-17 ENCOUNTER — Emergency Department (HOSPITAL_BASED_OUTPATIENT_CLINIC_OR_DEPARTMENT_OTHER)
Admission: EM | Admit: 2023-06-17 | Discharge: 2023-06-17 | Disposition: A | Discharge status: Home / Self Care | Payer: BC Managed Care – PPO | Attending: Emergency Medicine | Admitting: Emergency Medicine

## 2023-06-17 DIAGNOSIS — M7989 Other specified soft tissue disorders: Secondary | ICD-10-CM | POA: Diagnosis present

## 2023-06-17 DIAGNOSIS — T50905A Adverse effect of unspecified drugs, medicaments and biological substances, initial encounter: Secondary | ICD-10-CM | POA: Insufficient documentation

## 2023-06-17 DIAGNOSIS — L03011 Cellulitis of right finger: Secondary | ICD-10-CM | POA: Diagnosis not present

## 2023-06-17 LAB — CBC WITH DIFFERENTIAL/PLATELET
Abs Immature Granulocytes: 0.03 10*3/uL (ref 0.00–0.07)
Basophils Absolute: 0.1 10*3/uL (ref 0.0–0.1)
Basophils Relative: 1 %
Eosinophils Absolute: 0.1 10*3/uL (ref 0.0–0.5)
Eosinophils Relative: 1 %
HCT: 38.8 % (ref 36.0–46.0)
Hemoglobin: 13.5 g/dL (ref 12.0–15.0)
Immature Granulocytes: 0 %
Lymphocytes Relative: 24 %
Lymphs Abs: 2 10*3/uL (ref 0.7–4.0)
MCH: 32.1 pg (ref 26.0–34.0)
MCHC: 34.8 g/dL (ref 30.0–36.0)
MCV: 92.2 fL (ref 80.0–100.0)
Monocytes Absolute: 0.5 10*3/uL (ref 0.1–1.0)
Monocytes Relative: 6 %
Neutro Abs: 5.9 10*3/uL (ref 1.7–7.7)
Neutrophils Relative %: 68 %
Platelets: 331 10*3/uL (ref 150–400)
RBC: 4.21 MIL/uL (ref 3.87–5.11)
RDW: 12.4 % (ref 11.5–15.5)
WBC: 8.6 10*3/uL (ref 4.0–10.5)
nRBC: 0 % (ref 0.0–0.2)

## 2023-06-17 LAB — BASIC METABOLIC PANEL
Anion gap: 7 (ref 5–15)
BUN: 10 mg/dL (ref 6–20)
CO2: 25 mmol/L (ref 22–32)
Calcium: 9.6 mg/dL (ref 8.9–10.3)
Chloride: 104 mmol/L (ref 98–111)
Creatinine, Ser: 0.82 mg/dL (ref 0.44–1.00)
GFR, Estimated: >60 mL/min (ref >60)
Glucose, Bld: 117 mg/dL — ABNORMAL HIGH (ref 70–99)
Potassium: 3.8 mmol/L (ref 3.5–5.1)
Sodium: 136 mmol/L (ref 135–145)

## 2023-06-17 MED ORDER — HYDROCODONE-ACETAMINOPHEN 5-325 MG PO TABS
1.0000 | ORAL_TABLET | Freq: Once | ORAL | Status: AC
  Administered 2023-06-17: 1 via ORAL
  Filled 2023-06-17: qty 1

## 2023-06-17 MED ORDER — DOXYCYCLINE HYCLATE 100 MG PO CAPS: 100.0000 mg | ORAL_CAPSULE | Freq: Two times a day (BID) | ORAL | 0 refills | Status: DC

## 2023-06-17 MED ORDER — SODIUM CHLORIDE 0.9 % IV SOLN
1.0000 g | Freq: Once | INTRAVENOUS | Status: AC
  Administered 2023-06-17: 1 g via INTRAVENOUS
  Filled 2023-06-17: qty 10

## 2023-06-17 MED ORDER — HYDROCODONE-ACETAMINOPHEN 5-325 MG PO TABS: 1.0000 | ORAL_TABLET | ORAL | 0 refills | Status: AC | PRN

## 2023-06-17 NOTE — ED Provider Notes (Signed)
[place] EMERGENCY DEPARTMENT AT [facility] Provider Note   CSN: [id number] Arrival date & time: 06/17/23  1731     History  Chief Complaint  Patient presents with   Cellulitis    Mary Frye is a 42 y.o. female.  Patient reports she pricked her finger with something that was in a on Saturday.  Patient reports that she had a small prick mark.  Patient reports that she developed a small blister.  Patient reports that she picked something black out of the blister.  Patient reports over the last 2 days her hand has had increased swelling.  Patient was seen at urgent care yesterday and started on Bactrim.  Patient reports she now has a rash.  Patient has a history of being allergic to Celebrex.  She denies any shortness of breath she denies any swelling she has not had any hives.  Patient reports that the swelling in her finger is increasing and becoming more painful        Home Medications Prior to Admission medications   Medication Sig Start Date End Date Taking? Authorizing Provider  amphetamine-dextroamphetamine (ADDERALL) 20 MG tablet 1 tablet 06/30/20   [provider]  buPROPion (WELLBUTRIN XL) 150 MG 24 hr tablet Take 150 mg by mouth every morning. 10/14/22   [provider]  sertraline (ZOLOFT) 50 MG tablet Take 50 mg by mouth daily. 09/22/20   [provider]      Allergies    Celebrex [celecoxib]    Review of Systems   Review of Systems  Skin:  Positive for rash.  All other systems reviewed and are negative.   Physical Exam Updated Vital Signs BP (!) 127/93   Pulse 76   Temp 97.8 F (36.6 C) (Oral)   Resp 16   SpO2 100%  Physical Exam Vitals and nursing note reviewed.  Constitutional:      Appearance: She is well-developed.  HENT:     Head: Normocephalic.  Cardiovascular:     Rate and Rhythm: Normal rate.  Pulmonary:     Effort: Pulmonary effort is normal.  Abdominal:     General: There is no distension.   Musculoskeletal:     Cervical back: Normal range of motion.     Comments: Swollen right index finger redness from proximal phalanx to metacarpal joint, erythema decreased range of motion neurovascular neurosensory intact  Skin:    Findings: Rash present.     Comments: Patient has a fine rash on her arms and legs.  Neurological:     General: No focal deficit present.     Mental Status: She is alert and oriented to person, place, and time.     ED Results / Procedures / Treatments   Labs (all labs ordered are listed, but only abnormal results are displayed) Labs Reviewed  BASIC METABOLIC PANEL - Abnormal; Notable for the following components:      Result Value   Glucose, Bld 117 (*)    All other components within normal limits  CBC WITH DIFFERENTIAL/PLATELET    EKG None  Radiology DG Finger Index Right  Result Date: 06/17/2023 CLINICAL DATA:  Cellulitis with injury. Query foreign body. Increasing pain and swelling after hand pricked on Saturday. EXAM: RIGHT INDEX FINGER 2+V COMPARISON:  None Available. FINDINGS: Diffuse soft tissue swelling of the right second finger, greatest along the base. No radiopaque soft tissue foreign body or soft tissue gas identified. No evidence of acute fracture or dislocation. No focal bone lesion  or bone destruction. Joint spaces are normal. IMPRESSION: Diffuse soft tissue swelling about the right second finger. No acute bony abnormalities. No radiopaque foreign bodies are identified. Electronically Signed   By: Burman Nieves M.D.   On: 06/17/2023 20:32    Procedures Procedures    Medications Ordered in ED Medications  cefTRIAXone (ROCEPHIN) 1 g in sodium chloride 0.9 % 100 mL IVPB (1 g Intravenous New Bag/Given 06/17/23 2041)    ED Course/ Medical Decision Making/ A&P                                 Medical Decision Making Patient complains of swelling to her right index finger after being poked by something and heavy on  Saturday  Amount and/or Complexity of Data Reviewed Labs: ordered. Decision-making details documented in ED Course.    Details: Labs ordered reviewed and interpreted.  Patient has a glucose of 117. Radiology: ordered and independent interpretation performed. Decision-making details documented in ED Course.    Details: X-ray right index finger shows no foreign body no abnormality  Risk Prescription drug management. Risk Details: Patient given Rocephin 1 mg IV.  Patient is advised to stop Bactrim.  Patient is given a prescription for doxycycline and hydrocodone.  Patient is advised to call Dr. Orlan Leavens to schedule follow-up.  Patient is advised to soak area 20 minutes 4 times a day.  Patient is advised to return to the emergency department if increased redness or swelling.   Tetanus 2021             Final Clinical Impression(s) / ED Diagnoses Final diagnoses:  Cellulitis of finger, right  Adverse effect of drug, initial encounter    Rx / DC Orders ED Discharge Orders          Ordered    doxycycline (VIBRAMYCIN) 100 MG capsule  2 times daily        06/17/23 2101    HYDROcodone-acetaminophen (NORCO/VICODIN) 5-325 MG tablet  Every 4 hours PRN        06/17/23 2101              Elson Areas, Cordelia Poche 06/17/23 2202    Elayne Snare K, DO 06/17/23 2324

## 2023-06-17 NOTE — ED Notes (Signed)
Cellulitis to the right index finger since Monday night. Was put on bactrim last night at urgent care, not getting better.

## 2023-06-17 NOTE — Discharge Instructions (Addendum)
Soak area 20 minutes 4 times a day °

## 2023-06-17 NOTE — ED Triage Notes (Signed)
Pt had her hand pricked while she had her hand in hay on Saturday and she did not think much of it but it has been getting worse, more swollen, she has pain and swelling to right index finger. She thinks maybe there is something in the finger

## 2023-12-18 ENCOUNTER — Ambulatory Visit (INDEPENDENT_AMBULATORY_CARE_PROVIDER_SITE_OTHER): Admitting: Audiology

## 2023-12-18 ENCOUNTER — Ambulatory Visit (INDEPENDENT_AMBULATORY_CARE_PROVIDER_SITE_OTHER): Admitting: Physician Assistant

## 2023-12-18 ENCOUNTER — Encounter (INDEPENDENT_AMBULATORY_CARE_PROVIDER_SITE_OTHER): Payer: Self-pay

## 2023-12-18 ENCOUNTER — Other Ambulatory Visit (INDEPENDENT_AMBULATORY_CARE_PROVIDER_SITE_OTHER): Payer: Self-pay | Admitting: Physician Assistant

## 2023-12-18 VITALS — BP 129/86 | HR 75 | Ht 67.0 in | Wt 152.0 lb

## 2023-12-18 DIAGNOSIS — Z0110 Encounter for examination of ears and hearing without abnormal findings: Secondary | ICD-10-CM | POA: Diagnosis not present

## 2023-12-18 DIAGNOSIS — H6991 Unspecified Eustachian tube disorder, right ear: Secondary | ICD-10-CM | POA: Diagnosis not present

## 2023-12-18 DIAGNOSIS — H93291 Other abnormal auditory perceptions, right ear: Secondary | ICD-10-CM | POA: Diagnosis not present

## 2023-12-18 MED ORDER — LORATADINE 10 MG PO TABS: 10.0000 mg | ORAL_TABLET | Freq: Every day | ORAL | 11 refills | Status: AC

## 2023-12-18 MED ORDER — FLUTICASONE PROPIONATE 50 MCG/ACT NA SUSP: 2.0000 | Freq: Every day | NASAL | 6 refills | Status: AC

## 2023-12-18 NOTE — Progress Notes (Signed)
 Dear Dr. Delice Felt, Here is my assessment for our mutual patient, Mary Frye. Thank you for allowing me the opportunity to care for your patient. Please do not hesitate to contact me should you have any other questions. Sincerely, Belma Boxer PA-C  Otolaryngology Clinic Note Referring provider: Dr. Delice Felt HPI:  Mary Frye is a 43 y.o. female kindly referred by Dr. Delice Felt   The patient is a 43 year old female seen in our office for evaluation of right ear pain.  The patient notes that she has discomfort predominantly in her right ear.  She notes that when she goes underwater she feels pressure in the right ear which is greater than in the left.  In the summertime she feels like she has fluid in the right ear only.  She notes that this does somewhat fluctuate as far as the pressure goes but has some constant discomfort.  She notes that when symptoms are more severe she does have hearing loss and things sound like she is underwater.  She denies any clicking or popping, she denies any dizziness or neurologic deficits.  She does occasionally get ringing in the right ear.  She notes that increased activity does make it worse.  She feels like she needs to pop her ears but she is unable to pop them.  She denies any history of seasonal allergies.  No head or neck surgeries.  She does report as a child she had numerous otitis media infections but did not have tympanostomy tubes placed.   Independent Review of Additional Tests or Records:  Audiological evaluation on 12/18/2023  Otoscopy: Right ear: Clear external ear canals and notable landmarks visualized on the tympanic membrane. Left ear:  Clear external ear canals and notable landmarks visualized on the tympanic membrane.   Tympanometry: Right ear: Type A- Normal external ear canal volume with normal middle ear pressure and tympanic membrane compliance Left ear: Type Ad- Normal external ear canal volume with normal middle ear pressure and high  tympanic membrane compliance     Pure tone Audiometry: Right ear- Normal hearing from [phone]. Left ear-Normal hearing from [phone].   Speech Audiometry: Right ear- Speech Reception Threshold (SRT) was obtained at 5 dBHL. Left ear-Speech Reception Threshold (SRT) was obtained at 5 dBHL.   Word Recognition Score Tested using NU-6 (MLV) Right ear: 100% was obtained at a presentation level of 50 dBHL with contralateral masking which is deemed as  excellent. Left ear: 100% was obtained at a presentation level of 50 dBHL with contralateral masking which is deemed as  excellent.   The hearing test results were completed under headphones and re-checked with inserts and results are deemed to be of good reliability. Test technique:  conventional         PMH/Meds/All/SocHx/FamHx/ROS:   Past Medical History:  Diagnosis Date   History of back injury    horse injury trt with physical therapy   History of concussion    years ago horse injury   Hx of varicella    Supraumbilical hernia    small   Vaginal Pap smear, abnormal      Past Surgical History:  Procedure Laterality Date   BREAST BIOPSY Right 02/11/2022   CESAREAN SECTION N/A 12/17/2015   Procedure: CESAREAN SECTION;  Surgeon: Thora Flint, MD;  Location: [facility];  Service: Obstetrics;  Laterality: N/A;   CESAREAN SECTION N/A 11/12/2017   Procedure: REPEAT CESAREAN SECTION;  Surgeon: Ashby Lawman, MD;  Location: [facility];  Service: Obstetrics;  Laterality: N/A;  REPEAT EDC 11/18/17 [facility]KA NEED RNFA   CESAREAN SECTION WITH BILATERAL TUBAL LIGATION N/A 02/16/2020   Procedure: CESAREAN SECTION WITH BILATERAL TUBAL LIGATION;  Surgeon: Dyanna Glasgow, DO;  Location: [facility];  Service: Obstetrics;  Laterality: N/A;   dental implants     43 yo   FOOT SURGERY     LEEP      Family History  Problem Relation Age of Onset   Leukemia Father 2   Breast cancer Maternal Aunt        58s   Seizures Maternal Aunt     Breast cancer Maternal Aunt        67s     Social Connections: Not on file      Current Outpatient Medications:    Methylphenidate HCl ER 54 MG TB24, TAKE 1 TABLET BY MOUTH EVERY DAY IN THE MORNING AS NEEDED FOR FOCUS, Disp: , Rfl:    sertraline (ZOLOFT) 50 MG tablet, Take 50 mg by mouth daily., Disp: , Rfl:    amphetamine-dextroamphetamine (ADDERALL) 20 MG tablet, 1 tablet, Disp: , Rfl:    buPROPion (WELLBUTRIN XL) 150 MG 24 hr tablet, Take 150 mg by mouth every morning. (Patient not taking: Reported on 12/18/2023), Disp: , Rfl:    doxycycline  (VIBRAMYCIN ) 100 MG capsule, Take 1 capsule (100 mg total) by mouth 2 (two) times daily. (Patient not taking: Reported on 12/18/2023), Disp: 20 capsule, Rfl: 0   HYDROcodone -acetaminophen  (NORCO/VICODIN) 5-325 MG tablet, Take 1 tablet by mouth every 4 (four) hours as needed for moderate pain (pain score 4-6). (Patient not taking: Reported on 12/18/2023), Disp: 10 tablet, Rfl: 0  Current Facility-Administered Medications:    diclofenac  Sodium (VOLTAREN ) 1 % topical gel 4 g, 4 g, Topical, QID PRN, Barb Bonito, MD   Physical Exam:   BP 129/86 (BP Location: Right Arm, Cuff Size: Normal)   Pulse 75   Ht 5\' 7"  (1.702 m)   Wt 152 lb (68.9 kg)   SpO2 97%   BMI 23.81 kg/m   Pertinent Findings  CN II-XII intact  Bilateral EAC clear and TM intact with well pneumatized middle ear spaces Weber 512: equal Rinne 512: AC > BC b/l  Anterior rhinoscopy: Septum left deviation ; bilateral inferior turbinates with no hypertrophy No lesions of oral cavity/oropharynx; dentition wnl No obviously palpable neck masses/lymphadenopathy/thyromegaly No respiratory distress or stridor    Seprately Identifiable Procedures:  None  Impression & Plans:  Mary Frye is a 43 y.o. female with the following   Right ear discomfort-  The patient presents today with complaints of right ear fullness and discomfort.  This is fluctuating but seems very consistent.   Her audiological evaluation shows normal right sided tympanometry, left side type AD.  Her symptoms are most consistent with eustachian tube dysfunction, I would like her to maximize medical therapy including Flonase  and a daily antihistamine as well as nasal saline irrigation.  She may have some underlying inflammation from previous history of recurrent ear infections as a kid.  If her symptoms or not improved with medical management I like to see her back in the office in 3 months for repeat evaluation or sooner as needed.  Have low suspicion for any underlying malignancy or deep-seated issue given the duration of symptoms and otherwise normal audiological evaluation today.   - f/u 3 months    Thank you for allowing me the opportunity to care for your patient. Please do not hesitate to contact me should you  have any other questions.  Sincerely, Belma Boxer PA-C [facility] Phone: [phone] Fax: [phone]  12/18/2023, 10:16 AM

## 2023-12-18 NOTE — Progress Notes (Signed)
  [address] [place], [place] [address] [phone]  Audiological Evaluation    Name: Mary Frye     DOB:   [birth year]      MRN:   [id number]                                                                                     Service Date: 12/18/2023     Accompanied by: unaccompanied   Patient comes today after Lorane Rocker, PA-C sent a referral for a hearing evaluation due to concerns with ear discomfort.   Symptoms Yes Details  Hearing loss  [x]  Perceives right sided hearing loss  Tinnitus  [x]  Right ear tinnitus- sometimes  Ear pain/ infections/pressure  [x]  Right ear soreness for about 20 years. Reports recurrent ear infections as a child.  Balance problems  []    Noise exposure history  [x]  Loud equipment  Previous ear surgeries  []    Family history of hearing loss  [x]  Grandmother with age  Amplification  []    Other  [x]  Patient says that the symptoms may intensify or show up when having a inflammatory process    Otoscopy: Right ear: Clear external ear canals and notable landmarks visualized on the tympanic membrane. Left ear:  Clear external ear canals and notable landmarks visualized on the tympanic membrane.  Tympanometry: Right ear: Type A- Normal external ear canal volume with normal middle ear pressure and tympanic membrane compliance Left ear: Type Ad- Normal external ear canal volume with normal middle ear pressure and high tympanic membrane compliance    Pure tone Audiometry: Right ear- Normal hearing from [phone] Hz. Left ear-Normal hearing from [phone] Hz.  Speech Audiometry: Right ear- Speech Reception Threshold (SRT) was obtained at 5 dBHL. Left ear-Speech Reception Threshold (SRT) was obtained at 5 dBHL.   Word Recognition Score Tested using NU-6 (MLV) Right ear: 100% was obtained at a presentation level of 50 dBHL with contralateral masking which is deemed as  excellent. Left ear: 100% was obtained at a presentation level of  50 dBHL with contralateral masking which is deemed as  excellent.   The hearing test results were completed under headphones and re-checked with inserts and results are deemed to be of good reliability. Test technique:  conventional     Recommendations: Follow up with ENT as scheduled for today. Return for a hearing evaluation if concerns with hearing changes arise or per MD recommendation. Use hearing protection when exposed to loud/damaging sounds.    Khaleelah Yowell MARIE LEROUX-MARTINEZ, AUD

## 2023-12-25 ENCOUNTER — Encounter: Payer: Self-pay | Admitting: Audiology

## 2024-04-18 ENCOUNTER — Other Ambulatory Visit: Payer: Self-pay | Admitting: Physician Assistant

## 2024-04-18 DIAGNOSIS — Z1231 Encounter for screening mammogram for malignant neoplasm of breast: Secondary | ICD-10-CM

## 2024-05-18 ENCOUNTER — Ambulatory Visit
Admission: RE | Admit: 2024-05-18 | Discharge: 2024-05-18 | Disposition: A | Discharge status: Home / Self Care | Source: Ambulatory Visit | Attending: Physician Assistant | Admitting: Physician Assistant

## 2024-05-18 DIAGNOSIS — Z1231 Encounter for screening mammogram for malignant neoplasm of breast: Secondary | ICD-10-CM

## 2024-05-23 ENCOUNTER — Other Ambulatory Visit: Payer: Self-pay | Admitting: Physician Assistant

## 2024-05-23 ENCOUNTER — Other Ambulatory Visit: Payer: Self-pay | Admitting: Medical Genetics

## 2024-05-23 DIAGNOSIS — R928 Other abnormal and inconclusive findings on diagnostic imaging of breast: Secondary | ICD-10-CM

## 2024-05-30 ENCOUNTER — Ambulatory Visit

## 2024-05-30 ENCOUNTER — Ambulatory Visit
Admission: RE | Admit: 2024-05-30 | Discharge: 2024-05-30 | Disposition: A | Discharge status: Home / Self Care | Source: Ambulatory Visit | Attending: Physician Assistant | Admitting: Physician Assistant

## 2024-05-30 DIAGNOSIS — R928 Other abnormal and inconclusive findings on diagnostic imaging of breast: Secondary | ICD-10-CM

## 2024-06-20 ENCOUNTER — Other Ambulatory Visit (HOSPITAL_COMMUNITY)
Admission: RE | Admit: 2024-06-20 | Discharge: 2024-06-20 | Disposition: A | Discharge status: Home / Self Care | Payer: Self-pay | Source: Ambulatory Visit | Attending: Medical Genetics | Admitting: Medical Genetics

## 2024-06-28 LAB — GENECONNECT MOLECULAR SCREEN: Genetic Analysis Overall Interpretation: NEGATIVE

## 2024-07-09 ENCOUNTER — Encounter (HOSPITAL_COMMUNITY): Payer: Self-pay

## 2024-07-09 ENCOUNTER — Other Ambulatory Visit: Payer: Self-pay

## 2024-07-09 ENCOUNTER — Emergency Department (HOSPITAL_COMMUNITY)

## 2024-07-09 ENCOUNTER — Inpatient Hospital Stay (HOSPITAL_COMMUNITY)
Admission: EM | Admit: 2024-07-09 | Discharge: 2024-07-13 | DRG: 552 | Disposition: A | Discharge status: Home Health | Attending: Surgery | Admitting: Surgery

## 2024-07-09 DIAGNOSIS — Y9352 Activity, horseback riding: Secondary | ICD-10-CM

## 2024-07-09 DIAGNOSIS — W19XXXA Unspecified fall, initial encounter: Principal | ICD-10-CM

## 2024-07-09 DIAGNOSIS — M545 Low back pain, unspecified: Secondary | ICD-10-CM | POA: Diagnosis not present

## 2024-07-09 DIAGNOSIS — Z87828 Personal history of other (healed) physical injury and trauma: Secondary | ICD-10-CM

## 2024-07-09 DIAGNOSIS — S22089A Unspecified fracture of T11-T12 vertebra, initial encounter for closed fracture: Secondary | ICD-10-CM

## 2024-07-09 DIAGNOSIS — Z803 Family history of malignant neoplasm of breast: Secondary | ICD-10-CM

## 2024-07-09 DIAGNOSIS — Z886 Allergy status to analgesic agent status: Secondary | ICD-10-CM

## 2024-07-09 DIAGNOSIS — S22088A Other fracture of T11-T12 vertebra, initial encounter for closed fracture: Secondary | ICD-10-CM | POA: Diagnosis not present

## 2024-07-09 DIAGNOSIS — Z87820 Personal history of traumatic brain injury: Secondary | ICD-10-CM

## 2024-07-09 DIAGNOSIS — M533 Sacrococcygeal disorders, not elsewhere classified: Secondary | ICD-10-CM | POA: Diagnosis present

## 2024-07-09 DIAGNOSIS — S3219XA Other fracture of sacrum, initial encounter for closed fracture: Secondary | ICD-10-CM | POA: Diagnosis present

## 2024-07-09 DIAGNOSIS — K7689 Other specified diseases of liver: Secondary | ICD-10-CM | POA: Diagnosis present

## 2024-07-09 DIAGNOSIS — S32018A Other fracture of first lumbar vertebra, initial encounter for closed fracture: Secondary | ICD-10-CM | POA: Diagnosis present

## 2024-07-09 DIAGNOSIS — Z806 Family history of leukemia: Secondary | ICD-10-CM

## 2024-07-09 DIAGNOSIS — S32019A Unspecified fracture of first lumbar vertebra, initial encounter for closed fracture: Secondary | ICD-10-CM

## 2024-07-09 DIAGNOSIS — S3210XA Unspecified fracture of sacrum, initial encounter for closed fracture: Secondary | ICD-10-CM

## 2024-07-09 DIAGNOSIS — Z882 Allergy status to sulfonamides status: Secondary | ICD-10-CM

## 2024-07-09 DIAGNOSIS — S22009A Unspecified fracture of unspecified thoracic vertebra, initial encounter for closed fracture: Secondary | ICD-10-CM | POA: Diagnosis present

## 2024-07-09 LAB — I-STAT CHEM 8, ED
BUN: 13 mg/dL (ref 6–20)
Calcium, Ion: 1.16 mmol/L (ref 1.15–1.40)
Chloride: 108 mmol/L (ref 98–111)
Creatinine, Ser: 0.8 mg/dL (ref 0.44–1.00)
Glucose, Bld: 95 mg/dL (ref 70–99)
HCT: 40 % (ref 36.0–46.0)
Hemoglobin: 13.6 g/dL (ref 12.0–15.0)
Potassium: 4.5 mmol/L (ref 3.5–5.1)
Sodium: 136 mmol/L (ref 135–145)
TCO2: 22 mmol/L (ref 22–32)

## 2024-07-09 LAB — COMPREHENSIVE METABOLIC PANEL WITH GFR
ALT: 22 U/L (ref 0–44)
AST: 33 U/L (ref 15–41)
Albumin: 4 g/dL (ref 3.5–5.0)
Alkaline Phosphatase: 59 U/L (ref 38–126)
Anion gap: 12 (ref 5–15)
BUN: 11 mg/dL (ref 6–20)
CO2: 18 mmol/L — ABNORMAL LOW (ref 22–32)
Calcium: 9.1 mg/dL (ref 8.9–10.3)
Chloride: 108 mmol/L (ref 98–111)
Creatinine, Ser: 0.85 mg/dL (ref 0.44–1.00)
GFR, Estimated: >60 mL/min (ref >60)
Glucose, Bld: 96 mg/dL (ref 70–99)
Potassium: 4.2 mmol/L (ref 3.5–5.1)
Sodium: 138 mmol/L (ref 135–145)
Total Bilirubin: 0.8 mg/dL (ref 0.0–1.2)
Total Protein: 6.9 g/dL (ref 6.5–8.1)

## 2024-07-09 LAB — CBC
HCT: 39.6 % (ref 36.0–46.0)
Hemoglobin: 13.7 g/dL (ref 12.0–15.0)
MCH: 31.7 pg (ref 26.0–34.0)
MCHC: 34.6 g/dL (ref 30.0–36.0)
MCV: 91.7 fL (ref 80.0–100.0)
Platelets: 310 K/uL (ref 150–400)
RBC: 4.32 MIL/uL (ref 3.87–5.11)
RDW: 12.1 % (ref 11.5–15.5)
WBC: 11.1 K/uL — ABNORMAL HIGH (ref 4.0–10.5)
nRBC: 0 % (ref 0.0–0.2)

## 2024-07-09 LAB — I-STAT CG4 LACTIC ACID, ED: Lactic Acid, Venous: 2.1 mmol/L (ref 0.5–1.9)

## 2024-07-09 LAB — TROPONIN I (HIGH SENSITIVITY): Troponin I (High Sensitivity): <2 ng/L (ref <18)

## 2024-07-09 LAB — PROTIME-INR
INR: 1 (ref 0.8–1.2)
Prothrombin Time: 13.6 s (ref 11.4–15.2)

## 2024-07-09 LAB — SAMPLE TO BLOOD BANK

## 2024-07-09 LAB — ETHANOL: Alcohol, Ethyl (B): <15 mg/dL (ref <15)

## 2024-07-09 LAB — HCG, SERUM, QUALITATIVE: Preg, Serum: NEGATIVE

## 2024-07-09 MED ORDER — FENTANYL CITRATE (PF) 50 MCG/ML IJ SOSY: 50.0000 ug | PREFILLED_SYRINGE | Freq: Once | INTRAMUSCULAR | Status: AC

## 2024-07-09 MED ORDER — HYDROMORPHONE HCL 1 MG/ML IJ SOLN
0.5000 mg | INTRAMUSCULAR | Status: DC | PRN
  Administered 2024-07-10: 0.5 mg via INTRAVENOUS
  Filled 2024-07-09: qty 1

## 2024-07-09 MED ORDER — ONDANSETRON HCL 4 MG/2ML IJ SOLN
4.0000 mg | Freq: Once | INTRAMUSCULAR | Status: AC
  Administered 2024-07-09: 4 mg via INTRAVENOUS
  Filled 2024-07-09: qty 2

## 2024-07-09 MED ORDER — ENOXAPARIN SODIUM 30 MG/0.3ML IJ SOSY
30.0000 mg | PREFILLED_SYRINGE | Freq: Two times a day (BID) | INTRAMUSCULAR | Status: DC
  Administered 2024-07-10 – 2024-07-13 (×7): 30 mg via SUBCUTANEOUS
  Filled 2024-07-09 (×7): qty 0.3

## 2024-07-09 MED ORDER — FENTANYL CITRATE (PF) 50 MCG/ML IJ SOSY
50.0000 ug | PREFILLED_SYRINGE | INTRAMUSCULAR | Status: AC | PRN
Start: 2024-07-09 — End: 2024-07-10
  Administered 2024-07-09 – 2024-07-10 (×4): 50 ug via INTRAVENOUS
  Filled 2024-07-09 (×4): qty 1

## 2024-07-09 MED ORDER — OXYCODONE HCL 5 MG PO TABS
5.0000 mg | ORAL_TABLET | ORAL | Status: DC | PRN
  Administered 2024-07-10 – 2024-07-12 (×9): 10 mg via ORAL
  Filled 2024-07-09 (×10): qty 2

## 2024-07-09 MED ORDER — METHOCARBAMOL 500 MG PO TABS
500.0000 mg | ORAL_TABLET | Freq: Three times a day (TID) | ORAL | Status: DC
  Administered 2024-07-10: 500 mg via ORAL
  Filled 2024-07-09: qty 1

## 2024-07-09 MED ORDER — HYDROMORPHONE HCL 1 MG/ML IJ SOLN
1.0000 mg | Freq: Once | INTRAMUSCULAR | Status: AC
  Administered 2024-07-09: 1 mg via INTRAVENOUS
  Filled 2024-07-09: qty 1

## 2024-07-09 MED ORDER — MELATONIN 3 MG PO TABS: 3.0000 mg | ORAL_TABLET | Freq: Every evening | ORAL | Status: DC | PRN

## 2024-07-09 MED ORDER — POLYETHYLENE GLYCOL 3350 17 G PO PACK
17.0000 g | PACK | Freq: Every day | ORAL | Status: DC | PRN
Start: 2024-07-09 — End: 2024-07-11
  Administered 2024-07-11: 17 g via ORAL
  Filled 2024-07-09: qty 1

## 2024-07-09 MED ORDER — LORAZEPAM 2 MG/ML IJ SOLN
1.0000 mg | INTRAMUSCULAR | Status: AC | PRN
  Administered 2024-07-09: 1 mg via INTRAVENOUS
  Filled 2024-07-09: qty 1

## 2024-07-09 MED ORDER — ONDANSETRON HCL 4 MG/2ML IJ SOLN
4.0000 mg | Freq: Four times a day (QID) | INTRAMUSCULAR | Status: DC | PRN
  Administered 2024-07-10 – 2024-07-11 (×3): 4 mg via INTRAVENOUS
  Filled 2024-07-09 (×3): qty 2

## 2024-07-09 MED ORDER — FENTANYL CITRATE (PF) 50 MCG/ML IJ SOSY
PREFILLED_SYRINGE | INTRAMUSCULAR | Status: AC
  Administered 2024-07-09: 50 ug via INTRAVENOUS
  Filled 2024-07-09: qty 1

## 2024-07-09 MED ORDER — FENTANYL CITRATE (PF) 50 MCG/ML IJ SOSY
50.0000 ug | PREFILLED_SYRINGE | Freq: Once | INTRAMUSCULAR | Status: AC
  Administered 2024-07-09: 50 ug via INTRAVENOUS
  Filled 2024-07-09: qty 1

## 2024-07-09 MED ORDER — METHOCARBAMOL 1000 MG/10ML IJ SOLN: 500.0000 mg | Freq: Three times a day (TID) | INTRAMUSCULAR | Status: DC

## 2024-07-09 MED ORDER — OXYCODONE-ACETAMINOPHEN 5-325 MG PO TABS
1.0000 | ORAL_TABLET | Freq: Once | ORAL | Status: AC
  Administered 2024-07-09: 1 via ORAL
  Filled 2024-07-09: qty 1

## 2024-07-09 MED ORDER — DOCUSATE SODIUM 100 MG PO CAPS
100.0000 mg | ORAL_CAPSULE | Freq: Two times a day (BID) | ORAL | Status: DC
  Administered 2024-07-10 – 2024-07-11 (×3): 100 mg via ORAL
  Filled 2024-07-09 (×3): qty 1

## 2024-07-09 MED ORDER — ONDANSETRON 4 MG PO TBDP: 4.0000 mg | ORAL_TABLET | Freq: Four times a day (QID) | ORAL | Status: DC | PRN

## 2024-07-09 MED ORDER — ACETAMINOPHEN 500 MG PO TABS
1000.0000 mg | ORAL_TABLET | Freq: Four times a day (QID) | ORAL | Status: DC
  Administered 2024-07-10 – 2024-07-13 (×14): 1000 mg via ORAL
  Filled 2024-07-09 (×15): qty 2

## 2024-07-09 MED ORDER — IOHEXOL 350 MG/ML SOLN
75.0000 mL | Freq: Once | INTRAVENOUS | Status: AC | PRN
  Administered 2024-07-09: 75 mL via INTRAVENOUS

## 2024-07-09 NOTE — ED Provider Notes (Signed)
 [place] EMERGENCY DEPARTMENT AT [facility] Provider Note   CSN: [id number] Arrival date & time: 07/09/24  1614     Patient presents with: Mary Frye is a 43 y.o. female.   HPI   43 year old female presenting to the emergency department as a level 2 trauma after falling from a horse.  The history is provided by EMS.  The patient was riding a horse and fell approximately 10 to 15 feet away from the horse landing on her head.  She was wearing a helmet, denies loss of consciousness and is not on anticoagulation.  She reported hearing a cracking in her spine and has been having severe pain in her lower spine ever since.  She denies any other injuries or complaints.  She arrives GCS 15, c-collar in place, ABC intact.  Prior to Admission medications   Medication Sig Start Date End Date Taking? Authorizing Provider  amphetamine-dextroamphetamine (ADDERALL) 20 MG tablet 1 tablet 06/30/20   [provider]  buPROPion (WELLBUTRIN XL) 150 MG 24 hr tablet Take 150 mg by mouth every morning. Patient not taking: Reported on 12/18/2023 10/14/22   [provider]  doxycycline  (VIBRAMYCIN ) 100 MG capsule Take 1 capsule (100 mg total) by mouth 2 (two) times daily. Patient not taking: Reported on 12/18/2023 06/17/23   Sofia, Leslie K, PA-C  fluticasone  (FLONASE ) 50 MCG/ACT nasal spray Place 2 sprays into both nostrils daily. 12/18/23   Hedges, Reyes, PA-C  loratadine  (CLARITIN ) 10 MG tablet Take 1 tablet (10 mg total) by mouth daily. 12/18/23   Hedges, Reyes, PA-C  Methylphenidate HCl ER 54 MG TB24 TAKE 1 TABLET BY MOUTH EVERY DAY IN THE MORNING AS NEEDED FOR FOCUS 05/28/23   [provider]  sertraline (ZOLOFT) 50 MG tablet Take 50 mg by mouth daily. 09/22/20   [provider]    Allergies: Celebrex  [celecoxib ]    Review of Systems  All other systems reviewed and are negative.   Updated Vital Signs BP 114/86   Pulse 80   Temp 97.8 F  (36.6 C) (Oral)   Resp 19   Ht 5' 7 (1.702 m)   Wt 79.4 kg   SpO2 100%   BMI 27.41 kg/m   Physical Exam Vitals and nursing note reviewed.  Constitutional:      General: She is not in acute distress.    Appearance: She is well-developed.     Comments: GCS 15, ABC intact  HENT:     Head: Normocephalic and atraumatic.  Eyes:     Extraocular Movements: Extraocular movements intact.     Conjunctiva/sclera: Conjunctivae normal.     Pupils: Pupils are equal, round, and reactive to light.  Neck:     Comments: No midline tenderness to palpation of the cervical spine.  Range of motion intact Cardiovascular:     Rate and Rhythm: Normal rate and regular rhythm.  Pulmonary:     Effort: Pulmonary effort is normal. No respiratory distress.     Breath sounds: Normal breath sounds.  Chest:     Comments: Clavicles stable nontender to AP compression.  Chest wall stable and nontender to AP and lateral compression. Abdominal:     Palpations: Abdomen is soft.     Tenderness: There is no abdominal tenderness.     Comments: Pelvis stable to lateral compression  Musculoskeletal:     Cervical back: Neck supple.     Comments: Extremities atraumatic with intact range of motion, pt in acute pain,  deferred rolling of patient  Skin:    General: Skin is warm and dry.  Neurological:     Mental Status: She is alert.     Comments: Cranial nerves II through XII grossly intact.  Moving all 4 extremities spontaneously.  Sensation grossly intact all 4 extremities     (all labs ordered are listed, but only abnormal results are displayed) Labs Reviewed  COMPREHENSIVE METABOLIC PANEL WITH GFR - Abnormal; Notable for the following components:      Result Value   CO2 18 (*)    All other components within normal limits  CBC - Abnormal; Notable for the following components:   WBC 11.1 (*)    All other components within normal limits  I-STAT CG4 LACTIC ACID, ED - Abnormal; Notable for the following  components:   Lactic Acid, Venous 2.1 (*)    All other components within normal limits  ETHANOL  PROTIME-INR  HCG, SERUM, QUALITATIVE  URINALYSIS, ROUTINE W REFLEX MICROSCOPIC  I-STAT CHEM 8, ED  SAMPLE TO BLOOD BANK  TROPONIN I (HIGH SENSITIVITY)    EKG: None  Radiology: CT T-SPINE NO CHARGE Result Date: 07/09/2024 EXAM: CT THORACIC AND LUMBAR SPINE WITH INTRAVENOUS CONTRAST 07/09/2024 05:36:37 PM TECHNIQUE: CT OF THE THORACIC AND LUMBAR SPINE WAS PERFORMED WITH THE ADMINISTRATION OF 75 ML OF IOHEXOL (OMNIPAQUE) 350 MG/ML INJECTION. MULTIPLANAR REFORMATTED IMAGES ARE PROVIDED FOR REVIEW. AUTOMATED EXPOSURE CONTROL, ITERATIVE RECONSTRUCTION, AND/OR WEIGHT BASED ADJUSTMENT OF THE MA/KV WAS UTILIZED TO REDUCE THE RADIATION DOSE TO AS LOW AS REASONABLY ACHIEVABLE. INCIDENTAL ADRENAL AND/OR RENAL FINDINGS DO NOT REQUIRE FOLLOW UP IMAGING. COMPARISON: NONE AVAILABLE. CLINICAL HISTORY: FINDINGS: THORACIC SPINE: BONES AND ALIGNMENT: ACUTE MINIMALLY DISPLACED SUPERIOR ANTERIOR CORNER T12 FRACTURE WITH A NONDISPLACED FRACTURE EXTENDING THROUGH THE SUPERIOR ASPECT OF THE T12 VERTEBRAL BODY. SUSPECTED SPINOUS PROCESS WIDENING AT THE T11-T12 AND T12-L1 LEVELS. ASYMMETRIC ALIGNMENT OF THE ARTICULAR FACETS AT THE T12-L1 LEVEL. NO PEDICLE FRACTURE OR LAMINA FRACTURE. NO JOINT FACETS. NO PERCHED FACETS. NORMAL VERTEBRAL BODY HEIGHTS (EXCEPT T12). NORMAL ALIGNMENT (EXCEPT FOR FINDINGS AT T11-T12 AND T12-L1). DEGENERATIVE CHANGES: NO SIGNIFICANT DEGENERATIVE CHANGES. SOFT TISSUES: NO ACUTE ABNORMALITY. LUMBAR SPINE: BONES AND ALIGNMENT: ACUTE NONDISPLACED TRANSVERSE FRACTURE THROUGH THE L1 VERTEBRAL BODY SUPERIOR ASPECT. SUSPECTED SPINOUS PROCESS WIDENING AT THE T12-L1 LEVEL. ASYMMETRIC ALIGNMENT OF THE ARTICULAR FACETS AT THE T12-L1 LEVEL. NO PEDICLE FRACTURE OR LAMINA FRACTURE. NO JOINT FACETS. NO PERCHED FACETS. NORMAL VERTEBRAL BODY HEIGHTS (EXCEPT L1). NORMAL ALIGNMENT (EXCEPT FOR FINDINGS AT T12-L1).  DEGENERATIVE CHANGES: NO SIGNIFICANT DEGENERATIVE CHANGES. SOFT TISSUES: NO ACUTE ABNORMALITY. IMPRESSION: 1. ACUTE MINIMALLY DISPLACED SUPERIOR ANTERIOR CORNER T12 FRACTURE WITH EXTENSION THROUGH THE SUPERIOR ASPECT OF THE T12 VERTEBRAL BODY. 2. ACUTE NONDISPLACED TRANSVERSE FRACTURE THROUGH THE SUPERIOR ASPECT OF THE L1 VERTEBRAL BODY. 3. SUSPECTED SPINOUS PROCESS WIDENING AT THE T11T12 AND T12L1 LEVELS. ASYMMETRIC ALIGNMENT OF THE T12L1 FACET JOINTS WITHOUT PERCHED FACETS. CONCERN FOR POSTERIOR TENSION BAND DISRUPTION, RECOMMEND MRI THORACOLUMBAR SPINE FOR FURTHER EVALUATION. Electronically signed by: Kate Plummer MD 07/09/2024 06:04 PM EST RP Workstation: [id number]   CT CHEST ABDOMEN PELVIS W CONTRAST Result Date: 07/09/2024 EXAM: CT CHEST, ABDOMEN AND PELVIS WITH CONTRAST 07/09/2024 05:36:37 PM TECHNIQUE: CT of the chest, abdomen and pelvis was performed with the administration of 75 mL of iohexol (OMNIPAQUE) 350 MG/ML injection. Multiplanar reformatted images are provided for review. Automated exposure control, iterative reconstruction, and/or weight based adjustment of the mA/kV was utilized to reduce the radiation dose to as low as reasonably achievable. COMPARISON: None available. CLINICAL HISTORY:  Polytrauma, blunt. FINDINGS: CHEST: MEDIASTINUM AND LYMPH NODES: Heart and pericardium are unremarkable. The central airways are clear. No mediastinal, hilar or axillary lymphadenopathy. LUNGS AND PLEURA: No focal consolidation or pulmonary edema. No pleural effusion or pneumothorax. ABDOMEN AND PELVIS: LIVER: Subcentimeter hypodensity of the caudate lobe, too small to characterize. GALLBLADDER AND BILE DUCTS: Gallbladder is unremarkable. No biliary ductal dilatation. SPLEEN: No acute abnormality. PANCREAS: No acute abnormality. ADRENAL GLANDS: No acute abnormality. KIDNEYS, URETERS AND BLADDER: No stones in the kidneys or ureters. No hydronephrosis. No perinephric or periureteral stranding. Urinary  bladder is unremarkable. GI AND BOWEL: Stomach demonstrates no acute abnormality. No small or large bowel wall thickening or dilatation. The appendix is unremarkable. Colonic diverticulosis. There is no bowel obstruction. REPRODUCTIVE ORGANS: The uterus is unremarkable. No adnexal mass. Right ovarian corpus luteum cysts x 2 - no further follow-up indicated. PERITONEUM AND RETROPERITONEUM: No ascites. No free air. No mesenteric hematoma. VASCULATURE: Aorta is normal in caliber. ABDOMINAL AND PELVIS LYMPH NODES: No lymphadenopathy. BONES AND SOFT TISSUES: No acute displaced rib fracture. No acute displaced sternal fracture. No acute displaced scapular fracture or partially visualized clavicular fracture. No acute displaced hip or pelvic fracture. No hip dislocation. No pelvic diastasis. Mild midline subcutaneous soft tissue hematoma at the level of the lower sacrum with associated acute anteriorly displaced fracture of the S5 level. Fracture extends to the lumbosacral foramina. Coccyx is unremarkable. Please see separately dictated CT thoracolumbar spine at 07/09/2024. IMPRESSION: 1. Acute anteriorly displaced S5 fracture with associated mild midline subcutaneous soft tissue hematoma; fracture extends to the lumbosacral foramina. 2. No additional acute displaced fractures of the chest, abdomen, or pelvis. 3. Please see separately dictated CT thoracolumbar spine at 07/09/2024. Electronically signed by: Morgane Naveau MD 07/09/2024 05:59 PM EST RP Workstation: [id number]   CT L-SPINE NO CHARGE Result Date: 07/09/2024 EXAM: CT THORACIC AND LUMBAR SPINE WITH INTRAVENOUS CONTRAST 07/09/2024 05:36:37 PM TECHNIQUE: CT of the thoracic and lumbar spine was performed with the administration of 75 mL of iohexol (OMNIPAQUE) 350 MG/ML injection. Multiplanar reformatted images are provided for review. Automated exposure control, iterative reconstruction, and/or weight based adjustment of the mA/kV was utilized to reduce the  radiation dose to as low as reasonably achievable. Incidental adrenal and/or renal findings do not require follow up imaging. COMPARISON: None available. CLINICAL HISTORY: FINDINGS: THORACIC SPINE: BONES AND ALIGNMENT: Acute minimally displaced superior anterior corner T12 fracture with a nondisplaced fracture extending through the superior aspect of the T12 vertebral body. Suspected spinous process widening at the T11-T12 and T12-L1 levels. Asymmetric alignment of the articular facets at the T12-L1 level. No pedicle fracture or lamina fracture. No joint facets. No perched facets. Normal vertebral body heights (except T12). Normal alignment (except for findings at T11-T12 and T12-L1). DEGENERATIVE CHANGES: No significant degenerative changes. SOFT TISSUES: No acute abnormality. LUMBAR SPINE: BONES AND ALIGNMENT: Acute nondisplaced transverse fracture through the L1 vertebral body superior aspect. Suspected spinous process widening at the T12-L1 level. Asymmetric alignment of the articular facets at the T12-L1 level. No pedicle fracture or lamina fracture. No joint facets. No perched facets. Normal vertebral body heights (except L1). Normal alignment (except for findings at T12-L1). DEGENERATIVE CHANGES: No significant degenerative changes. SOFT TISSUES: No acute abnormality. IMPRESSION: 1. Acute minimally displaced superior anterior corner T12 fracture with extension through the superior aspect of the T12 vertebral body. 2. Acute nondisplaced transverse fracture through the superior aspect of the L1 vertebral body. 3. Suspected spinous process widening at the T11T12 and T12L1 levels.  Asymmetric alignment of the T12L1 facet joints without perched facets. Concern for posterior tension band disruption, recommend MRI thoracolumbar spine for further evaluation. Electronically signed by: Morgane Naveau MD 07/09/2024 05:58 PM EST RP Workstation: [id number]   CT CERVICAL SPINE WO CONTRAST Result Date:  07/09/2024 EXAM: CT CERVICAL SPINE WITHOUT CONTRAST 07/09/2024 05:36:37 PM TECHNIQUE: CT of the cervical spine was performed without the administration of intravenous contrast. Multiplanar reformatted images are provided for review. Automated exposure control, iterative reconstruction, and/or weight based adjustment of the mA/kV was utilized to reduce the radiation dose to as low as reasonably achievable. COMPARISON: None available. CLINICAL HISTORY: Polytrauma, blunt. FINDINGS: CERVICAL SPINE: BONES AND ALIGNMENT: Slight straightening of the normal cervical lordosis likely due to positioning. No acute fracture or traumatic malalignment. DEGENERATIVE CHANGES: Multilevel mild degenerative changes of the spine. SOFT TISSUES: No prevertebral soft tissue swelling. LUNGS: No definite apical pneumothorax. Biopsy for pleural/pulmonary scarring. IMPRESSION: 1. No acute abnormality of the cervical spine. Electronically signed by: Morgane Naveau MD 07/09/2024 05:44 PM EST RP Workstation: [id number]   CT HEAD WO CONTRAST Result Date: 07/09/2024 EXAM: CT HEAD WITHOUT CONTRAST 07/09/2024 05:36:37 PM TECHNIQUE: CT of the head was performed without the administration of intravenous contrast. Automated exposure control, iterative reconstruction, and/or weight based adjustment of the mA/kV was utilized to reduce the radiation dose to as low as reasonably achievable. COMPARISON: None available. CLINICAL HISTORY: Head trauma, moderate-severe. FINDINGS: BRAIN AND VENTRICLES: High right frontal hypodensity within the white matter likely chronic microvascular ischemic changes. No acute hemorrhage. No evidence of acute infarct. No hydrocephalus. No extra-axial collection. No mass effect or midline shift. ORBITS: No acute abnormality. SINUSES: No acute abnormality. SOFT TISSUES AND SKULL: No acute soft tissue abnormality. No skull fracture. IMPRESSION: 1. No acute intracranial abnormality. Electronically signed by: Morgane Naveau MD  07/09/2024 05:41 PM EST RP Workstation: [id number]   DG Pelvis Portable Result Date: 07/09/2024 CLINICAL DATA:  Clemens off horse. EXAM: PORTABLE PELVIS 1-2 VIEWS COMPARISON:  None Available. FINDINGS: There is no evidence of an acute pelvic fracture or diastasis. A chronic appearing deformity is suspected within the region of the symphysis pubis on the left. No pelvic bone lesions are seen. IMPRESSION: 1. No acute fracture or diastasis. 2. Chronic appearing deformity within the region of the symphysis pubis on the left. Correlation with physical examination is recommended to determine the presence of point tenderness. Electronically Signed   By: Suzen Dials M.D.   On: 07/09/2024 17:00   DG Chest Port 1 View Result Date: 07/09/2024 CLINICAL DATA:  Clemens off of a horse. EXAM: PORTABLE CHEST 1 VIEW COMPARISON:  None Available. FINDINGS: The heart size and mediastinal contours are within normal limits. Both lungs are clear. The visualized skeletal structures are unremarkable. IMPRESSION: No active disease. Electronically Signed   By: Suzen Dials M.D.   On: 07/09/2024 16:58     Procedures   Medications Ordered in the ED  LORazepam (ATIVAN) injection 1 mg (has no administration in time range)  fentaNYL  (SUBLIMAZE ) injection 50 mcg (50 mcg Intravenous Given by Other 07/09/24 1626)  fentaNYL  (SUBLIMAZE ) injection 50 mcg (50 mcg Intravenous Given 07/09/24 1733)  iohexol (OMNIPAQUE) 350 MG/ML injection 75 mL (75 mLs Intravenous Contrast Given 07/09/24 1737)  ondansetron  (ZOFRAN ) injection 4 mg (4 mg Intravenous Given 07/09/24 1830)  Medical Decision Making Amount and/or Complexity of Data Reviewed Labs: ordered. Radiology: ordered.  Risk Prescription drug management. Decision regarding hospitalization.    43 year old female presenting to the emergency department as a level 2 trauma after falling from a horse.  The history is provided by EMS.  The  patient was riding a horse and fell approximately 10 to 15 feet away from the horse landing on her head.  She was wearing a helmet, denies loss of consciousness and is not on anticoagulation.  She reported hearing a cracking in her spine and has been having severe pain in her lower spine ever since.  She denies any other injuries or complaints.  She arrives GCS 15, c-collar in place, ABC intact.  On arrival, the patient was vitally stable, afebrile, not tachycardic or tachypneic.  Patient complaining of acute pain on arrival, fentanyl  50 mcg administered for pain control.  Patient had been administered 100 mcg of fentanyl  with EMS and had been administered pain dose ketamine.  CXR: IMPRESSION:  No active disease.    Pelvis XR: IMPRESSION:  1. No acute fracture or diastasis.  2. Chronic appearing deformity within the region of the symphysis  pubis on the left. Correlation with physical examination is  recommended to determine the presence of point tenderness.   CT Head and Cervical Spine: IMPRESSION:  1. No acute intracranial abnormality.   IMPRESSION:  1. No acute abnormality of the cervical spine.   CT C/A/P: IMPRESSION:  1. Acute anteriorly displaced S5 fracture with associated mild midline  subcutaneous soft tissue hematoma; fracture extends to the lumbosacral  foramina.  2. No additional acute displaced fractures of the chest, abdomen, or pelvis.  3. Please see separately dictated CT thoracolumbar spine at 07/09/2024.   CT T Spine: IMPRESSION:    1. ACUTE MINIMALLY DISPLACED SUPERIOR ANTERIOR CORNER T12 FRACTURE WITH    EXTENSION THROUGH THE SUPERIOR ASPECT OF THE T12 VERTEBRAL BODY.    2. ACUTE NONDISPLACED TRANSVERSE FRACTURE THROUGH THE SUPERIOR ASPECT OF THE L1    VERTEBRAL BODY.    3. SUSPECTED SPINOUS PROCESS WIDENING AT THE T11T12 AND T12L1 LEVELS.    ASYMMETRIC ALIGNMENT OF THE T12L1 FACET JOINTS WITHOUT PERCHED FACETS. CONCERN    FOR POSTERIOR TENSION  BAND DISRUPTION, RECOMMEND MRI THORACOLUMBAR SPINE FOR    FURTHER EVALUATION.   CT L Spine: IMPRESSION:  1. Acute minimally displaced superior anterior corner T12 fracture with  extension through the superior aspect of the T12 vertebral body.  2. Acute nondisplaced transverse fracture through the superior aspect of the L1  vertebral body.  3. Suspected spinous process widening at the T11T12 and T12L1 levels.  Asymmetric alignment of the T12L1 facet joints without perched facets. Concern  for posterior tension band disruption, recommend MRI thoracolumbar spine for  further evaluation.    Consults: Discussed with neurosurgery, spoke with Luke Pean NP.  Will see in consultation. Recommended MRI thoracolumbar spine per radiology recommendations.  Discussed with on-call orthopedics, Dr. Georgina who will see in consultation, did not recommend any operative management, affected portion of the sacrum is below the weightbearing portion of the pelvis.  Discussed care of the patient with on-call trauma surgery, Medford Pizza, MD who recommended following up on neurosurgery recommendations, re-engage once recommendations complete.   MRI imaging of the spine pending at time of signout, signout given to Dr. Doretha at 2200 to follow-up results of MRI imaging, ultimate disposition likely admission for pain control instead in the setting of the patient's fractures.  Final diagnoses:  Fall, initial encounter  Closed fracture of twelfth thoracic vertebra, unspecified fracture morphology, initial encounter [facility])  Closed fracture of first lumbar vertebra, unspecified fracture morphology, initial encounter [facility])  Closed fracture of sacrum, unspecified portion of sacrum, initial encounter [facility])    ED Discharge Orders     None          Jerrol Agent, MD 07/09/24 2204

## 2024-07-09 NOTE — ED Notes (Signed)
 Trauma Event Note    Pt to ED via [facility] - was riding her horse and was thrown from horse-- landing on back-= c/o severe pain in low back- received ketamine enroute per EMS- \ 18 G IV in left AC per EMS C-Collar on per EMS  Full sensation and movement in all extremities, no LOC, GCS 15   Last imported Vital Signs BP 129/77   Pulse 94   Temp 97.8 F (36.6 C) (Oral)   Resp 19   Ht 5' 7 (1.702 Mary)   Wt 175 lb (79.4 kg)   SpO2 100%   BMI 27.41 kg/Mary   Trending CBC Recent Labs    07/09/24 1634 07/09/24 1641  WBC 11.1*  --   HGB 13.7 13.6  HCT 39.6 40.0  PLT 310  --     Trending Coag's Recent Labs    07/09/24 1634  INR 1.0    Trending BMET Recent Labs    07/09/24 1641  NA 136  K 4.5  CL 108  BUN 13  CREATININE 0.80  GLUCOSE 95      Mary Frye Mary Frye  Trauma Response RN  Please call TRN at [phone] for further assistance.

## 2024-07-09 NOTE — Consult Note (Signed)
 Reason for Consult: T12 fracture Referring Physician: EDP  Mary Frye is an 43 y.o. female.   HPI:  43 year old female who was thrown from a horse earlier today.  She noted the onset of acute thoracolumbar back pain.  She felt a pop in her back.  She denies significant leg pain or numbness or tingling or weakness in the legs.  No bowel or bladder issues.  She does complain of sacral pain.  Imaging suggested a fracture at T12 and L1 and potentially some widening of the facet complex and interspinous distance.  MRI of the thoracic and lumbar spine has been done.  Pain is moderate and aching and worse with turning in bed.  Orthopedics has been consulted about the sacral fracture.  Past Medical History:  Diagnosis Date   History of back injury    horse injury trt with physical therapy   History of concussion    years ago horse injury   Hx of varicella    Supraumbilical hernia    small   Vaginal Pap smear, abnormal     Past Surgical History:  Procedure Laterality Date   BREAST BIOPSY Right 02/11/2022   CESAREAN SECTION N/A 12/17/2015   Procedure: CESAREAN SECTION;  Surgeon: Lynwood Clubs, MD;  Location: [facility];  Service: Obstetrics;  Laterality: N/A;   CESAREAN SECTION N/A 11/12/2017   Procedure: REPEAT CESAREAN SECTION;  Surgeon: Latisha Medford, MD;  Location: [facility];  Service: Obstetrics;  Laterality: N/A;  REPEAT EDC 11/18/17 [facility] NEED RNFA   CESAREAN SECTION WITH BILATERAL TUBAL LIGATION N/A 02/16/2020   Procedure: CESAREAN SECTION WITH BILATERAL TUBAL LIGATION;  Surgeon: Dannielle Bouchard, DO;  Location: [facility];  Service: Obstetrics;  Laterality: N/A;   dental implants     43 yo   FOOT SURGERY     LEEP      Allergies  Allergen Reactions   Celebrex  [Celecoxib ] Hives    Social History   Tobacco Use   Smoking status: Never   Smokeless tobacco: Never  Substance Use Topics   Alcohol use: No    Family History  Problem Relation Age of Onset   Leukemia Father  87   Breast cancer Maternal Aunt        40s   Seizures Maternal Aunt    Breast cancer Maternal Aunt        40s     Review of Systems  Positive ROS: Negative  All other systems have been reviewed and were otherwise negative with the exception of those mentioned in the HPI and as above.  Objective: Vital signs in last 24 hours: Temp:  [97.8 F (36.6 C)-97.9 F (36.6 C)] 97.9 F (36.6 C) (11/15 2026) Pulse Rate:  [80-106] 96 (11/15 2124) Resp:  [16-19] 16 (11/15 2122) BP: (114-129)/(77-88) 116/80 (11/15 2122) SpO2:  [96 %-100 %] 97 % (11/15 2124) Weight:  [79.4 kg] 79.4 kg (11/15 1624)  General Appearance: Alert, cooperative, no distress, appears stated age Head: Normocephalic, without obvious abnormality, atraumatic Eyes: PERRL, conjunctiva/corneas clear, EOM's intact     Neck: Supple, symmetrical, trachea midline Lungs: respirations unlabored Heart: Regular rate and rhythm Abdomen: Soft Extremities: Extremities normal, atraumatic, no cyanosis or edema Pulses: 2+ and symmetric all extremities Skin: Skin color, texture, turgor normal, no rashes or lesions  NEUROLOGIC:   Mental status: A&O x4, no aphasia, good attention span, Memory and fund of knowledge appear to be appropriate Motor Exam - grossly normal, normal tone and bulk as best I can  tell to an in bed exam, some of it is limited by pain Sensory Exam - grossly normal Reflexes:  Coordination - grossly normal Gait -not tested Balance -not tested Cranial Nerves: I: smell Not tested  II: visual acuity  OS: na  OD: na  II: visual fields Full to confrontation  II: pupils Equal, round, reactive to light  III,VII: ptosis None  III,IV,VI: extraocular muscles  Full ROM  V: mastication Normal  V: facial light touch sensation  Normal  V,VII: corneal reflex  Present  VII: facial muscle function - upper  Normal  VII: facial muscle function - lower Normal  VIII: hearing Not tested  IX: soft palate elevation  Normal   IX,X: gag reflex Present  XI: trapezius strength  5/5  XI: sternocleidomastoid strength 5/5  XI: neck flexion strength  5/5  XII: tongue strength  Normal    Data Review Lab Results  Component Value Date   WBC 11.1 (H) 07/09/2024   HGB 13.6 07/09/2024   HCT 40.0 07/09/2024   MCV 91.7 07/09/2024   PLT 310 07/09/2024   Lab Results  Component Value Date   NA 136 07/09/2024   K 4.5 07/09/2024   CL 108 07/09/2024   CO2 18 (L) 07/09/2024   BUN 13 07/09/2024   CREATININE 0.80 07/09/2024   GLUCOSE 95 07/09/2024   Lab Results  Component Value Date   INR 1.0 07/09/2024    Radiology: CT T-SPINE NO CHARGE Result Date: 07/09/2024 EXAM: CT THORACIC AND LUMBAR SPINE WITH INTRAVENOUS CONTRAST 07/09/2024 05:36:37 PM TECHNIQUE: CT OF THE THORACIC AND LUMBAR SPINE WAS PERFORMED WITH THE ADMINISTRATION OF 75 ML OF IOHEXOL (OMNIPAQUE) 350 MG/ML INJECTION. MULTIPLANAR REFORMATTED IMAGES ARE PROVIDED FOR REVIEW. AUTOMATED EXPOSURE CONTROL, ITERATIVE RECONSTRUCTION, AND/OR WEIGHT BASED ADJUSTMENT OF THE MA/KV WAS UTILIZED TO REDUCE THE RADIATION DOSE TO AS LOW AS REASONABLY ACHIEVABLE. INCIDENTAL ADRENAL AND/OR RENAL FINDINGS DO NOT REQUIRE FOLLOW UP IMAGING. COMPARISON: NONE AVAILABLE. CLINICAL HISTORY: FINDINGS: THORACIC SPINE: BONES AND ALIGNMENT: ACUTE MINIMALLY DISPLACED SUPERIOR ANTERIOR CORNER T12 FRACTURE WITH A NONDISPLACED FRACTURE EXTENDING THROUGH THE SUPERIOR ASPECT OF THE T12 VERTEBRAL BODY. SUSPECTED SPINOUS PROCESS WIDENING AT THE T11-T12 AND T12-L1 LEVELS. ASYMMETRIC ALIGNMENT OF THE ARTICULAR FACETS AT THE T12-L1 LEVEL. NO PEDICLE FRACTURE OR LAMINA FRACTURE. NO JOINT FACETS. NO PERCHED FACETS. NORMAL VERTEBRAL BODY HEIGHTS (EXCEPT T12). NORMAL ALIGNMENT (EXCEPT FOR FINDINGS AT T11-T12 AND T12-L1). DEGENERATIVE CHANGES: NO SIGNIFICANT DEGENERATIVE CHANGES. SOFT TISSUES: NO ACUTE ABNORMALITY. LUMBAR SPINE: BONES AND ALIGNMENT: ACUTE NONDISPLACED TRANSVERSE FRACTURE THROUGH THE L1 VERTEBRAL  BODY SUPERIOR ASPECT. SUSPECTED SPINOUS PROCESS WIDENING AT THE T12-L1 LEVEL. ASYMMETRIC ALIGNMENT OF THE ARTICULAR FACETS AT THE T12-L1 LEVEL. NO PEDICLE FRACTURE OR LAMINA FRACTURE. NO JOINT FACETS. NO PERCHED FACETS. NORMAL VERTEBRAL BODY HEIGHTS (EXCEPT L1). NORMAL ALIGNMENT (EXCEPT FOR FINDINGS AT T12-L1). DEGENERATIVE CHANGES: NO SIGNIFICANT DEGENERATIVE CHANGES. SOFT TISSUES: NO ACUTE ABNORMALITY. IMPRESSION: 1. ACUTE MINIMALLY DISPLACED SUPERIOR ANTERIOR CORNER T12 FRACTURE WITH EXTENSION THROUGH THE SUPERIOR ASPECT OF THE T12 VERTEBRAL BODY. 2. ACUTE NONDISPLACED TRANSVERSE FRACTURE THROUGH THE SUPERIOR ASPECT OF THE L1 VERTEBRAL BODY. 3. SUSPECTED SPINOUS PROCESS WIDENING AT THE T11T12 AND T12L1 LEVELS. ASYMMETRIC ALIGNMENT OF THE T12L1 FACET JOINTS WITHOUT PERCHED FACETS. CONCERN FOR POSTERIOR TENSION BAND DISRUPTION, RECOMMEND MRI THORACOLUMBAR SPINE FOR FURTHER EVALUATION. Electronically signed by: Morgane Naveau MD 07/09/2024 06:04 PM EST RP Workstation: [id number]   CT CHEST ABDOMEN PELVIS W CONTRAST Result Date: 07/09/2024 EXAM: CT CHEST, ABDOMEN AND PELVIS WITH CONTRAST 07/09/2024 05:36:37 PM TECHNIQUE: CT of  the chest, abdomen and pelvis was performed with the administration of 75 mL of iohexol (OMNIPAQUE) 350 MG/ML injection. Multiplanar reformatted images are provided for review. Automated exposure control, iterative reconstruction, and/or weight based adjustment of the mA/kV was utilized to reduce the radiation dose to as low as reasonably achievable. COMPARISON: None available. CLINICAL HISTORY: Polytrauma, blunt. FINDINGS: CHEST: MEDIASTINUM AND LYMPH NODES: Heart and pericardium are unremarkable. The central airways are clear. No mediastinal, hilar or axillary lymphadenopathy. LUNGS AND PLEURA: No focal consolidation or pulmonary edema. No pleural effusion or pneumothorax. ABDOMEN AND PELVIS: LIVER: Subcentimeter hypodensity of the caudate lobe, too small to characterize. GALLBLADDER  AND BILE DUCTS: Gallbladder is unremarkable. No biliary ductal dilatation. SPLEEN: No acute abnormality. PANCREAS: No acute abnormality. ADRENAL GLANDS: No acute abnormality. KIDNEYS, URETERS AND BLADDER: No stones in the kidneys or ureters. No hydronephrosis. No perinephric or periureteral stranding. Urinary bladder is unremarkable. GI AND BOWEL: Stomach demonstrates no acute abnormality. No small or large bowel wall thickening or dilatation. The appendix is unremarkable. Colonic diverticulosis. There is no bowel obstruction. REPRODUCTIVE ORGANS: The uterus is unremarkable. No adnexal mass. Right ovarian corpus luteum cysts x 2 - no further follow-up indicated. PERITONEUM AND RETROPERITONEUM: No ascites. No free air. No mesenteric hematoma. VASCULATURE: Aorta is normal in caliber. ABDOMINAL AND PELVIS LYMPH NODES: No lymphadenopathy. BONES AND SOFT TISSUES: No acute displaced rib fracture. No acute displaced sternal fracture. No acute displaced scapular fracture or partially visualized clavicular fracture. No acute displaced hip or pelvic fracture. No hip dislocation. No pelvic diastasis. Mild midline subcutaneous soft tissue hematoma at the level of the lower sacrum with associated acute anteriorly displaced fracture of the S5 level. Fracture extends to the lumbosacral foramina. Coccyx is unremarkable. Please see separately dictated CT thoracolumbar spine at 07/09/2024. IMPRESSION: 1. Acute anteriorly displaced S5 fracture with associated mild midline subcutaneous soft tissue hematoma; fracture extends to the lumbosacral foramina. 2. No additional acute displaced fractures of the chest, abdomen, or pelvis. 3. Please see separately dictated CT thoracolumbar spine at 07/09/2024. Electronically signed by: Morgane Naveau MD 07/09/2024 05:59 PM EST RP Workstation: [id number]   CT L-SPINE NO CHARGE Result Date: 07/09/2024 EXAM: CT THORACIC AND LUMBAR SPINE WITH INTRAVENOUS CONTRAST 07/09/2024 05:36:37 PM TECHNIQUE:  CT of the thoracic and lumbar spine was performed with the administration of 75 mL of iohexol (OMNIPAQUE) 350 MG/ML injection. Multiplanar reformatted images are provided for review. Automated exposure control, iterative reconstruction, and/or weight based adjustment of the mA/kV was utilized to reduce the radiation dose to as low as reasonably achievable. Incidental adrenal and/or renal findings do not require follow up imaging. COMPARISON: None available. CLINICAL HISTORY: FINDINGS: THORACIC SPINE: BONES AND ALIGNMENT: Acute minimally displaced superior anterior corner T12 fracture with a nondisplaced fracture extending through the superior aspect of the T12 vertebral body. Suspected spinous process widening at the T11-T12 and T12-L1 levels. Asymmetric alignment of the articular facets at the T12-L1 level. No pedicle fracture or lamina fracture. No joint facets. No perched facets. Normal vertebral body heights (except T12). Normal alignment (except for findings at T11-T12 and T12-L1). DEGENERATIVE CHANGES: No significant degenerative changes. SOFT TISSUES: No acute abnormality. LUMBAR SPINE: BONES AND ALIGNMENT: Acute nondisplaced transverse fracture through the L1 vertebral body superior aspect. Suspected spinous process widening at the T12-L1 level. Asymmetric alignment of the articular facets at the T12-L1 level. No pedicle fracture or lamina fracture. No joint facets. No perched facets. Normal vertebral body heights (except L1). Normal alignment (except for findings at T12-L1). DEGENERATIVE  CHANGES: No significant degenerative changes. SOFT TISSUES: No acute abnormality. IMPRESSION: 1. Acute minimally displaced superior anterior corner T12 fracture with extension through the superior aspect of the T12 vertebral body. 2. Acute nondisplaced transverse fracture through the superior aspect of the L1 vertebral body. 3. Suspected spinous process widening at the T11T12 and T12L1 levels. Asymmetric alignment of the  T12L1 facet joints without perched facets. Concern for posterior tension band disruption, recommend MRI thoracolumbar spine for further evaluation. Electronically signed by: Morgane Naveau MD 07/09/2024 05:58 PM EST RP Workstation: [id number]   CT CERVICAL SPINE WO CONTRAST Result Date: 07/09/2024 EXAM: CT CERVICAL SPINE WITHOUT CONTRAST 07/09/2024 05:36:37 PM TECHNIQUE: CT of the cervical spine was performed without the administration of intravenous contrast. Multiplanar reformatted images are provided for review. Automated exposure control, iterative reconstruction, and/or weight based adjustment of the mA/kV was utilized to reduce the radiation dose to as low as reasonably achievable. COMPARISON: None available. CLINICAL HISTORY: Polytrauma, blunt. FINDINGS: CERVICAL SPINE: BONES AND ALIGNMENT: Slight straightening of the normal cervical lordosis likely due to positioning. No acute fracture or traumatic malalignment. DEGENERATIVE CHANGES: Multilevel mild degenerative changes of the spine. SOFT TISSUES: No prevertebral soft tissue swelling. LUNGS: No definite apical pneumothorax. Biopsy for pleural/pulmonary scarring. IMPRESSION: 1. No acute abnormality of the cervical spine. Electronically signed by: Morgane Naveau MD 07/09/2024 05:44 PM EST RP Workstation: [id number]   CT HEAD WO CONTRAST Result Date: 07/09/2024 EXAM: CT HEAD WITHOUT CONTRAST 07/09/2024 05:36:37 PM TECHNIQUE: CT of the head was performed without the administration of intravenous contrast. Automated exposure control, iterative reconstruction, and/or weight based adjustment of the mA/kV was utilized to reduce the radiation dose to as low as reasonably achievable. COMPARISON: None available. CLINICAL HISTORY: Head trauma, moderate-severe. FINDINGS: BRAIN AND VENTRICLES: High right frontal hypodensity within the white matter likely chronic microvascular ischemic changes. No acute hemorrhage. No evidence of acute infarct. No hydrocephalus.  No extra-axial collection. No mass effect or midline shift. ORBITS: No acute abnormality. SINUSES: No acute abnormality. SOFT TISSUES AND SKULL: No acute soft tissue abnormality. No skull fracture. IMPRESSION: 1. No acute intracranial abnormality. Electronically signed by: Morgane Naveau MD 07/09/2024 05:41 PM EST RP Workstation: [id number]   DG Pelvis Portable Result Date: 07/09/2024 CLINICAL DATA:  Clemens off horse. EXAM: PORTABLE PELVIS 1-2 VIEWS COMPARISON:  None Available. FINDINGS: There is no evidence of an acute pelvic fracture or diastasis. A chronic appearing deformity is suspected within the region of the symphysis pubis on the left. No pelvic bone lesions are seen. IMPRESSION: 1. No acute fracture or diastasis. 2. Chronic appearing deformity within the region of the symphysis pubis on the left. Correlation with physical examination is recommended to determine the presence of point tenderness. Electronically Signed   By: Suzen Dials M.D.   On: 07/09/2024 17:00   DG Chest Port 1 View Result Date: 07/09/2024 CLINICAL DATA:  Clemens off of a horse. EXAM: PORTABLE CHEST 1 VIEW COMPARISON:  None Available. FINDINGS: The heart size and mediastinal contours are within normal limits. Both lungs are clear. The visualized skeletal structures are unremarkable. IMPRESSION: No active disease. Electronically Signed   By: Suzen Dials M.D.   On: 07/09/2024 16:58      Assessment/Plan: Estimated body mass index is 27.41 kg/m as calculated from the following:   Height as of this encounter: 5' 7 (1.702 m).   Weight as of this encounter: 51.61 kg.   43 year old female with small anterior chip fractures of T12 and L1 without significant loss of  vertebral body height or retropulsion or kyphosis.  I do not see any obvious ligamentous injury or canal stenosis on the MRI but we will await the radiology report.  Hopefully this can be treated in a TLSO brace.  She has pain from the sacral fracture.   Orthopedics has left an opinion regarding this.  Await MRI reading.  If no evidence of ligamentous injury then can mobilize in TLSO brace.   Alm GORMAN Molt 07/09/2024 10:06 PM

## 2024-07-09 NOTE — Progress Notes (Signed)
 Orthopedic Tech Progress Note Patient Details:  Mary Frye [birth year] [id number] Level 2 Trauma  Patient ID: [name], female   DOB: [birth year], 43 y.o.   MRN: [id number]  [name] Bar 07/09/2024, 4:48 PM

## 2024-07-09 NOTE — ED Notes (Signed)
 Patient returned from MRI.

## 2024-07-09 NOTE — ED Notes (Signed)
 This nurse paged ortho tech, currently waiting response.

## 2024-07-09 NOTE — ED Notes (Signed)
 XR at bedside

## 2024-07-09 NOTE — ED Triage Notes (Addendum)
 Pt to ED via EMS with c/o fall from horse. Pt riding horse and was bucked off. Pt fell approx 10-42ft away from horse and landed on her head. Pt was wearing helmet. -LOC. Pt reports hearing crackling and popping down her spine. Pt arrives in c-collar. Pt given 16mg  ketamine and 100mcg fentanyl  pta. Pt A&Ox4. Pt maintaining own airway and secretions. Pt breathing even and unlabored. No uncontrolled bleeding noted. PERRL. +PMS to all extremities.  Manual BP 126/80

## 2024-07-09 NOTE — ED Notes (Addendum)
 Patient transported to MRI

## 2024-07-09 NOTE — Progress Notes (Signed)
 Orthopedic Tech Progress Note Patient Details:  TULEEN MANDELBAUM 1980-09-26 [id number]  Patient ID: [name], female   DOB: 1981-04-21, 43 y.o.   MRN: [id number] I was called for the tlso. The rn said pt would be admitted so I told them the pt would get the brace on the floor, but to call if they needed to get up and I would bring it. Chandra Dorn PARAS 07/09/2024, 11:21 PM

## 2024-07-09 NOTE — ED Provider Notes (Signed)
 Assumed care from Dr. Jerrol at 1045.  Patient waiting on MRI results which showed acute T12 and L1 superior endplate fractures which extend posteriorly through the vertebral body to the posterior vertebral body cortex with mild adjacent paraspinal edema anteriorly at T12 without specific evidence of ligamentous injury and patient also has an acute anteriorly displaced S5 fracture with associated mild midline subcutaneous soft tissue hematoma.  Discussing with Dr. Joshua with neurosurgery and he felt since patient does not have any signs of ligamentous injury on MRI imaging patient can be placed in a TLSO brace.  Patient is requiring frequent rounds of pain medication.  Feel that she will need admission for pain control.  Will discuss with trauma surgery for admission.  Dr. Georgina with orthopedics reports there is no intervention that is needed for the S5 fracture. Patient is still having significant pain.  Pain regime was changed patient was given IV and a dose of oral medication.  Also will try to move her over to a hospital bed which may also help with some pain.   Doretha Folks, MD 07/09/24 [phone]

## 2024-07-10 ENCOUNTER — Inpatient Hospital Stay (HOSPITAL_COMMUNITY)

## 2024-07-10 DIAGNOSIS — S3210XA Unspecified fracture of sacrum, initial encounter for closed fracture: Secondary | ICD-10-CM

## 2024-07-10 DIAGNOSIS — Z886 Allergy status to analgesic agent status: Secondary | ICD-10-CM | POA: Diagnosis not present

## 2024-07-10 DIAGNOSIS — Z806 Family history of leukemia: Secondary | ICD-10-CM | POA: Diagnosis not present

## 2024-07-10 DIAGNOSIS — Z87820 Personal history of traumatic brain injury: Secondary | ICD-10-CM | POA: Diagnosis not present

## 2024-07-10 DIAGNOSIS — Z882 Allergy status to sulfonamides status: Secondary | ICD-10-CM | POA: Diagnosis not present

## 2024-07-10 DIAGNOSIS — Z87828 Personal history of other (healed) physical injury and trauma: Secondary | ICD-10-CM | POA: Diagnosis not present

## 2024-07-10 DIAGNOSIS — S32018A Other fracture of first lumbar vertebra, initial encounter for closed fracture: Secondary | ICD-10-CM | POA: Diagnosis present

## 2024-07-10 DIAGNOSIS — Y9352 Activity, horseback riding: Secondary | ICD-10-CM | POA: Diagnosis not present

## 2024-07-10 DIAGNOSIS — M533 Sacrococcygeal disorders, not elsewhere classified: Secondary | ICD-10-CM | POA: Diagnosis present

## 2024-07-10 DIAGNOSIS — S3219XA Other fracture of sacrum, initial encounter for closed fracture: Secondary | ICD-10-CM | POA: Diagnosis present

## 2024-07-10 DIAGNOSIS — S22088A Other fracture of T11-T12 vertebra, initial encounter for closed fracture: Secondary | ICD-10-CM | POA: Diagnosis present

## 2024-07-10 DIAGNOSIS — M545 Low back pain, unspecified: Secondary | ICD-10-CM | POA: Diagnosis present

## 2024-07-10 DIAGNOSIS — Z803 Family history of malignant neoplasm of breast: Secondary | ICD-10-CM | POA: Diagnosis not present

## 2024-07-10 DIAGNOSIS — K7689 Other specified diseases of liver: Secondary | ICD-10-CM | POA: Diagnosis present

## 2024-07-10 MED ORDER — AMPHETAMINE-DEXTROAMPHETAMINE 10 MG PO TABS: 20.0000 mg | ORAL_TABLET | Freq: Every day | ORAL | Status: DC | PRN

## 2024-07-10 MED ORDER — VENLAFAXINE HCL ER 75 MG PO CP24
75.0000 mg | ORAL_CAPSULE | Freq: Every day | ORAL | Status: DC
  Administered 2024-07-10 – 2024-07-13 (×4): 75 mg via ORAL
  Filled 2024-07-10 (×4): qty 1

## 2024-07-10 MED ORDER — GABAPENTIN 300 MG PO CAPS: 300.0000 mg | ORAL_CAPSULE | Freq: Three times a day (TID) | ORAL | Status: DC

## 2024-07-10 MED ORDER — HYDROMORPHONE HCL 1 MG/ML IJ SOLN
0.5000 mg | INTRAMUSCULAR | Status: DC | PRN
  Administered 2024-07-10 – 2024-07-13 (×12): 0.5 mg via INTRAVENOUS
  Filled 2024-07-10 (×12): qty 0.5

## 2024-07-10 MED ORDER — GABAPENTIN 300 MG PO CAPS
300.0000 mg | ORAL_CAPSULE | Freq: Three times a day (TID) | ORAL | Status: DC
  Administered 2024-07-10 – 2024-07-13 (×12): 300 mg via ORAL
  Filled 2024-07-10 (×9): qty 1
  Filled 2024-07-10: qty 3
  Filled 2024-07-10 (×2): qty 1

## 2024-07-10 MED ORDER — METHOCARBAMOL 500 MG PO TABS
1000.0000 mg | ORAL_TABLET | Freq: Three times a day (TID) | ORAL | Status: DC
  Administered 2024-07-10 – 2024-07-11 (×5): 1000 mg via ORAL
  Filled 2024-07-10 (×5): qty 2

## 2024-07-10 NOTE — H&P (Addendum)
 Mary Frye Dec 28, 1980  [id number].     HPI:  Ms. Mary Frye is a 43 yo female who presented to the ED after falling from a horse. She felt severe pain in her back immediately after the fall. She was wearing a helmet and denies loss of consciousness. She is able to move all extremities with no numbness or weakness. She has been hemodynamically stable. Imaging workup showed an S5 fracture, and T12/L1 vertebral body fractures. An MRI did not show ligamentous injuries. Neurosurgery has evaluated the patient and recommends a TLSO brace. The patient has continued to have significant back pain. Trauma was consulted for admission.  She is overall in good health. She does not take any blood thinners.  ROS: Review of Systems  Constitutional:  Negative for chills and fever.  Eyes:  Negative for blurred vision.  Respiratory:  Negative for shortness of breath.   Cardiovascular:  Negative for chest pain.  Gastrointestinal:  Negative for abdominal pain, nausea and vomiting.  Musculoskeletal:  Positive for back pain. Negative for neck pain.  Neurological:  Negative for focal weakness and loss of consciousness.    Family History  Problem Relation Age of Onset   Leukemia Father 90   Breast cancer Maternal Aunt        60s   Seizures Maternal Aunt    Breast cancer Maternal Aunt        59s    Past Medical History:  Diagnosis Date   History of back injury    horse injury trt with physical therapy   History of concussion    years ago horse injury   Hx of varicella    Supraumbilical hernia    small   Vaginal Pap smear, abnormal     Past Surgical History:  Procedure Laterality Date   BREAST BIOPSY Right 02/11/2022   CESAREAN SECTION N/A 12/17/2015   Procedure: CESAREAN SECTION;  Surgeon: Lynwood Clubs, MD;  Location: [facility]S;  Service: Obstetrics;  Laterality: N/A;   CESAREAN SECTION N/A 11/12/2017   Procedure: REPEAT CESAREAN SECTION;  Surgeon: Latisha Medford, MD;  Location: [facility] BIRTHING  SUITES;  Service: Obstetrics;  Laterality: N/A;  REPEAT EDC 11/18/17 NDKA NEED RNFA   CESAREAN SECTION WITH BILATERAL TUBAL LIGATION N/A 02/16/2020   Procedure: CESAREAN SECTION WITH BILATERAL TUBAL LIGATION;  Surgeon: Dannielle Bouchard, DO;  Location: [facility] ORS;  Service: Obstetrics;  Laterality: N/A;   dental implants     43 yo   FOOT SURGERY     LEEP      Social History:  reports that she has never smoked. She has never used smokeless tobacco. She reports that she does not drink alcohol and does not use drugs.  Allergies:  Allergies  Allergen Reactions   Sulfa Antibiotics Hives, Itching and Rash   Celebrex  [Celecoxib ] Hives    (Not in a hospital admission)    Physical Exam: Blood pressure 114/79, pulse 65, temperature 97.7 F (36.5 C), temperature source Oral, resp. rate 16, height 5' 7 (1.702 m), weight 79.4 kg, SpO2 96%. General: resting comfortably, appears stated age, no apparent distress Neurological: alert and oriented, no focal deficits, cranial nerves grossly in tact HEENT: normocephalic, atraumatic, no C spine tenderness to palpation CV: regular rate and rhythm, extremities warm and well-perfused Respiratory: normal work of breathing on room air, symmetric chest wall expansion, no chest wall deformities Abdomen: soft, nondistended, nontender to deep palpation. No masses or organomegaly. Extremities: warm and well-perfused, no deformities, moving all extremities spontaneously. Superficial  abrasion on the left elbow. Skin: warm and dry, abrasion on left elbow   Results for orders placed or performed during the hospital encounter of 07/09/24 (from the past 48 hours)  Sample to Blood Bank     Status: None   Collection Time: 07/09/24  4:32 PM  Result Value Ref Range   Blood Bank Specimen SAMPLE AVAILABLE FOR TESTING    Sample Expiration      07/12/2024,2359 Performed at [facility], [address] [place], [place] [address]   Comprehensive metabolic panel      Status: Abnormal   Collection Time: 07/09/24  4:34 PM  Result Value Ref Range   Sodium 138 135 - 145 mmol/L   Potassium 4.2 3.5 - 5.1 mmol/L    Comment: HEMOLYSIS AT THIS LEVEL MAY AFFECT RESULT   Chloride 108 98 - 111 mmol/L   CO2 18 (L) 22 - 32 mmol/L   Glucose, Bld 96 70 - 99 mg/dL    Comment: Glucose reference range applies only to samples taken after fasting for at least 8 hours.   BUN 11 6 - 20 mg/dL   Creatinine, Ser 9.14 0.44 - 1.00 mg/dL   Calcium 9.1 8.9 - 89.6 mg/dL   Total Protein 6.9 6.5 - 8.1 g/dL   Albumin 4.0 3.5 - 5.0 g/dL   AST 33 15 - 41 U/L    Comment: HEMOLYSIS AT THIS LEVEL MAY AFFECT RESULT   ALT 22 0 - 44 U/L    Comment: HEMOLYSIS AT THIS LEVEL MAY AFFECT RESULT   Alkaline Phosphatase 59 38 - 126 U/L   Total Bilirubin 0.8 0.0 - 1.2 mg/dL    Comment: HEMOLYSIS AT THIS LEVEL MAY AFFECT RESULT   GFR, Estimated >60 >60 mL/min    Comment: (NOTE) Calculated using the CKD-EPI Creatinine Equation (2021)    Anion gap 12 5 - 15    Comment: Performed at [facility], [address] [place], [place] [address]  CBC     Status: Abnormal   Collection Time: 07/09/24  4:34 PM  Result Value Ref Range   WBC 11.1 (H) 4.0 - 10.5 K/uL   RBC 4.32 3.87 - 5.11 MIL/uL   Hemoglobin 13.7 12.0 - 15.0 g/dL   HCT 60.3 63.9 - 53.9 %   MCV 91.7 80.0 - 100.0 fL   MCH 31.7 26.0 - 34.0 pg   MCHC 34.6 30.0 - 36.0 g/dL   RDW 87.8 88.4 - 84.4 %   Platelets 310 150 - 400 K/uL   nRBC 0.0 0.0 - 0.2 %    Comment: Performed at [facility], [address], [place], [place] [address]  Ethanol     Status: None   Collection Time: 07/09/24  4:34 PM  Result Value Ref Range   Alcohol, Ethyl (B) <15 <15 mg/dL    Comment: (NOTE) For medical purposes only. Performed at [facility], [address], [place], [place] [address]   Protime-INR     Status: None   Collection Time: 07/09/24  4:34 PM  Result Value Ref Range   Prothrombin Time 13.6 11.4 - 15.2 seconds   INR 1.0 0.8  - 1.2    Comment: (NOTE) INR goal varies based on device and disease states. Performed at [facility], [address] [place], [place] [address]   hCG, serum, qualitative     Status: None   Collection Time: 07/09/24  4:34 PM  Result Value Ref Range  Preg, Serum NEGATIVE NEGATIVE    Comment:        THE SENSITIVITY OF THIS METHODOLOGY IS >10 mIU/mL. Performed at [facility], [address], [place], [place] [address]   Troponin I (High Sensitivity)     Status: None   Collection Time: 07/09/24  4:34 PM  Result Value Ref Range   Troponin I (High Sensitivity) <2 <18 ng/L    Comment: (NOTE) Elevated high sensitivity troponin I (hsTnI) values and significant  changes across serial measurements may suggest ACS but many other  chronic and acute conditions are known to elevate hsTnI results.  Refer to the Links section for chest pain algorithms and additional  guidance. Performed at [facility], [address] [place], [place] [address]   I-Stat Chem 8, ED     Status: None   Collection Time: 07/09/24  4:41 PM  Result Value Ref Range   Sodium 136 135 - 145 mmol/L   Potassium 4.5 3.5 - 5.1 mmol/L   Chloride 108 98 - 111 mmol/L   BUN 13 6 - 20 mg/dL   Creatinine, Ser 9.19 0.44 - 1.00 mg/dL   Glucose, Bld 95 70 - 99 mg/dL    Comment: Glucose reference range applies only to samples taken after fasting for at least 8 hours.   Calcium, Ion 1.16 1.15 - 1.40 mmol/L   TCO2 22 22 - 32 mmol/L   Hemoglobin 13.6 12.0 - 15.0 g/dL   HCT 59.9 63.9 - 53.9 %  I-Stat Lactic Acid, ED     Status: Abnormal   Collection Time: 07/09/24  4:42 PM  Result Value Ref Range   Lactic Acid, Venous 2.1 (HH) 0.5 - 1.9 mmol/L   Comment NOTIFIED PHYSICIAN    MR LUMBAR SPINE WO CONTRAST Result Date: 07/09/2024 EXAM: MRI Thoracic and Lumbar Spine Without Intravenous Contrast 07/09/2024 09:14:08 PM TECHNIQUE: Multiplanar multisequence MRI of the thoracic and lumbar spine was performed  without the administration of intravenous contrast. COMPARISON: None available. CLINICAL HISTORY: Back trauma, abnormal neuro exam, CT or xray positive (Age >= 16y); Widening of facet joints on CT, fall from horse FINDINGS: BONES AND ALIGNMENT: Mild S shaped thoracolumbar curvature.  No substantial subluxation. Acute T12 and L1 superior endplate fractures which extends posteriorly through the vertebral body to the posterior cortex. SPINAL CORD: Normal spinal cord size. Normal spinal cord signal. SOFT TISSUES: Mild paraspinal edema anterior to T12. No posterior paraspinal/interspinous edema or specific evidence of ligamentous injury. THORACIC DISC LEVELS: No significant disc herniation. No spinal canal stenosis or neural foraminal narrowing. L1-L2: No significant disc herniation. No spinal canal stenosis or neural foraminal narrowing. L2-L3: No significant disc herniation. No spinal canal stenosis or neural foraminal narrowing. L3-L4: Mild broad disc bulge with Central disc protrusion. Mild spinal canal stenosis. No significant neural foraminal narrowing. L4-L5: Left eccentric disc bulge. Mild left subarticular recess stenosis. No spinal canal stenosis or neural foraminal narrowing. L5-S1: Mild broad disc bulge with Central disc protrusion. Mild bilateral subarticular recess and foraminal stenosis.  Patent canal. IMPRESSION: 1. Acute T12 and L1 superior endplate fractures which extend posteriorly through the vertebral body to the posterior vertebral body cortex. 2. Mild adjacent paraspinal edema anteriorly at T12. No posterior paraspinal/interspinous edema or specific evidence of ligamentous injury. Electronically signed by: Gilmore Molt MD 07/09/2024 10:37 PM EST RP Workstation: [id number]   MR THORACIC SPINE WO CONTRAST Result Date: 07/09/2024 EXAM: MRI Thoracic and Lumbar Spine Without Intravenous Contrast 07/09/2024 09:14:08 PM TECHNIQUE: Multiplanar  multisequence MRI of the thoracic and lumbar spine was  performed without the administration of intravenous contrast. COMPARISON: None available. CLINICAL HISTORY: Back trauma, abnormal neuro exam, CT or xray positive (Age >= 16y); Widening of facet joints on CT, fall from horse FINDINGS: BONES AND ALIGNMENT: Mild S shaped thoracolumbar curvature.  No substantial subluxation. Acute T12 and L1 superior endplate fractures which extends posteriorly through the vertebral body to the posterior cortex. SPINAL CORD: Normal spinal cord size. Normal spinal cord signal. SOFT TISSUES: Mild paraspinal edema anterior to T12. No posterior paraspinal/interspinous edema or specific evidence of ligamentous injury. THORACIC DISC LEVELS: No significant disc herniation. No spinal canal stenosis or neural foraminal narrowing. L1-L2: No significant disc herniation. No spinal canal stenosis or neural foraminal narrowing. L2-L3: No significant disc herniation. No spinal canal stenosis or neural foraminal narrowing. L3-L4: Mild broad disc bulge with Central disc protrusion. Mild spinal canal stenosis. No significant neural foraminal narrowing. L4-L5: Left eccentric disc bulge. Mild left subarticular recess stenosis. No spinal canal stenosis or neural foraminal narrowing. L5-S1: Mild broad disc bulge with Central disc protrusion. Mild bilateral subarticular recess and foraminal stenosis.  Patent canal. IMPRESSION: 1. Acute T12 and L1 superior endplate fractures which extend posteriorly through the vertebral body to the posterior vertebral body cortex. 2. Mild adjacent paraspinal edema anteriorly at T12. No posterior paraspinal/interspinous edema or specific evidence of ligamentous injury. Electronically signed by: Gilmore Molt MD 07/09/2024 10:37 PM EST RP Workstation: [id number]   CT T-SPINE NO CHARGE Result Date: 07/09/2024 EXAM: CT THORACIC AND LUMBAR SPINE WITH INTRAVENOUS CONTRAST 07/09/2024 05:36:37 PM TECHNIQUE: CT OF THE THORACIC AND LUMBAR SPINE WAS PERFORMED WITH THE  ADMINISTRATION OF 75 ML OF IOHEXOL (OMNIPAQUE) 350 MG/ML INJECTION. MULTIPLANAR REFORMATTED IMAGES ARE PROVIDED FOR REVIEW. AUTOMATED EXPOSURE CONTROL, ITERATIVE RECONSTRUCTION, AND/OR WEIGHT BASED ADJUSTMENT OF THE MA/KV WAS UTILIZED TO REDUCE THE RADIATION DOSE TO AS LOW AS REASONABLY ACHIEVABLE. INCIDENTAL ADRENAL AND/OR RENAL FINDINGS DO NOT REQUIRE FOLLOW UP IMAGING. COMPARISON: NONE AVAILABLE. CLINICAL HISTORY: FINDINGS: THORACIC SPINE: BONES AND ALIGNMENT: ACUTE MINIMALLY DISPLACED SUPERIOR ANTERIOR CORNER T12 FRACTURE WITH A NONDISPLACED FRACTURE EXTENDING THROUGH THE SUPERIOR ASPECT OF THE T12 VERTEBRAL BODY. SUSPECTED SPINOUS PROCESS WIDENING AT THE T11-T12 AND T12-L1 LEVELS. ASYMMETRIC ALIGNMENT OF THE ARTICULAR FACETS AT THE T12-L1 LEVEL. NO PEDICLE FRACTURE OR LAMINA FRACTURE. NO JOINT FACETS. NO PERCHED FACETS. NORMAL VERTEBRAL BODY HEIGHTS (EXCEPT T12). NORMAL ALIGNMENT (EXCEPT FOR FINDINGS AT T11-T12 AND T12-L1). DEGENERATIVE CHANGES: NO SIGNIFICANT DEGENERATIVE CHANGES. SOFT TISSUES: NO ACUTE ABNORMALITY. LUMBAR SPINE: BONES AND ALIGNMENT: ACUTE NONDISPLACED TRANSVERSE FRACTURE THROUGH THE L1 VERTEBRAL BODY SUPERIOR ASPECT. SUSPECTED SPINOUS PROCESS WIDENING AT THE T12-L1 LEVEL. ASYMMETRIC ALIGNMENT OF THE ARTICULAR FACETS AT THE T12-L1 LEVEL. NO PEDICLE FRACTURE OR LAMINA FRACTURE. NO JOINT FACETS. NO PERCHED FACETS. NORMAL VERTEBRAL BODY HEIGHTS (EXCEPT L1). NORMAL ALIGNMENT (EXCEPT FOR FINDINGS AT T12-L1). DEGENERATIVE CHANGES: NO SIGNIFICANT DEGENERATIVE CHANGES. SOFT TISSUES: NO ACUTE ABNORMALITY. IMPRESSION: 1. ACUTE MINIMALLY DISPLACED SUPERIOR ANTERIOR CORNER T12 FRACTURE WITH EXTENSION THROUGH THE SUPERIOR ASPECT OF THE T12 VERTEBRAL BODY. 2. ACUTE NONDISPLACED TRANSVERSE FRACTURE THROUGH THE SUPERIOR ASPECT OF THE L1 VERTEBRAL BODY. 3. SUSPECTED SPINOUS PROCESS WIDENING AT THE T11T12 AND T12L1 LEVELS. ASYMMETRIC ALIGNMENT OF THE T12L1 FACET JOINTS WITHOUT PERCHED FACETS. CONCERN FOR  POSTERIOR TENSION BAND DISRUPTION, RECOMMEND MRI THORACOLUMBAR SPINE FOR FURTHER EVALUATION. Electronically signed by: Morgane Naveau MD 07/09/2024 06:04 PM EST RP Workstation: [id number]   CT CHEST ABDOMEN PELVIS W CONTRAST Result Date: 07/09/2024 EXAM: CT CHEST, ABDOMEN AND PELVIS WITH  CONTRAST 07/09/2024 05:36:37 PM TECHNIQUE: CT of the chest, abdomen and pelvis was performed with the administration of 75 mL of iohexol (OMNIPAQUE) 350 MG/ML injection. Multiplanar reformatted images are provided for review. Automated exposure control, iterative reconstruction, and/or weight based adjustment of the mA/kV was utilized to reduce the radiation dose to as low as reasonably achievable. COMPARISON: None available. CLINICAL HISTORY: Polytrauma, blunt. FINDINGS: CHEST: MEDIASTINUM AND LYMPH NODES: Heart and pericardium are unremarkable. The central airways are clear. No mediastinal, hilar or axillary lymphadenopathy. LUNGS AND PLEURA: No focal consolidation or pulmonary edema. No pleural effusion or pneumothorax. ABDOMEN AND PELVIS: LIVER: Subcentimeter hypodensity of the caudate lobe, too small to characterize. GALLBLADDER AND BILE DUCTS: Gallbladder is unremarkable. No biliary ductal dilatation. SPLEEN: No acute abnormality. PANCREAS: No acute abnormality. ADRENAL GLANDS: No acute abnormality. KIDNEYS, URETERS AND BLADDER: No stones in the kidneys or ureters. No hydronephrosis. No perinephric or periureteral stranding. Urinary bladder is unremarkable. GI AND BOWEL: Stomach demonstrates no acute abnormality. No small or large bowel wall thickening or dilatation. The appendix is unremarkable. Colonic diverticulosis. There is no bowel obstruction. REPRODUCTIVE ORGANS: The uterus is unremarkable. No adnexal mass. Right ovarian corpus luteum cysts x 2 - no further follow-up indicated. PERITONEUM AND RETROPERITONEUM: No ascites. No free air. No mesenteric hematoma. VASCULATURE: Aorta is normal in caliber. ABDOMINAL AND  PELVIS LYMPH NODES: No lymphadenopathy. BONES AND SOFT TISSUES: No acute displaced rib fracture. No acute displaced sternal fracture. No acute displaced scapular fracture or partially visualized clavicular fracture. No acute displaced hip or pelvic fracture. No hip dislocation. No pelvic diastasis. Mild midline subcutaneous soft tissue hematoma at the level of the lower sacrum with associated acute anteriorly displaced fracture of the S5 level. Fracture extends to the lumbosacral foramina. Coccyx is unremarkable. Please see separately dictated CT thoracolumbar spine at 07/09/2024. IMPRESSION: 1. Acute anteriorly displaced S5 fracture with associated mild midline subcutaneous soft tissue hematoma; fracture extends to the lumbosacral foramina. 2. No additional acute displaced fractures of the chest, abdomen, or pelvis. 3. Please see separately dictated CT thoracolumbar spine at 07/09/2024. Electronically signed by: Morgane Naveau MD 07/09/2024 05:59 PM EST RP Workstation: [id number]   CT L-SPINE NO CHARGE Result Date: 07/09/2024 EXAM: CT THORACIC AND LUMBAR SPINE WITH INTRAVENOUS CONTRAST 07/09/2024 05:36:37 PM TECHNIQUE: CT of the thoracic and lumbar spine was performed with the administration of 75 mL of iohexol (OMNIPAQUE) 350 MG/ML injection. Multiplanar reformatted images are provided for review. Automated exposure control, iterative reconstruction, and/or weight based adjustment of the mA/kV was utilized to reduce the radiation dose to as low as reasonably achievable. Incidental adrenal and/or renal findings do not require follow up imaging. COMPARISON: None available. CLINICAL HISTORY: FINDINGS: THORACIC SPINE: BONES AND ALIGNMENT: Acute minimally displaced superior anterior corner T12 fracture with a nondisplaced fracture extending through the superior aspect of the T12 vertebral body. Suspected spinous process widening at the T11-T12 and T12-L1 levels. Asymmetric alignment of the articular facets at the  T12-L1 level. No pedicle fracture or lamina fracture. No joint facets. No perched facets. Normal vertebral body heights (except T12). Normal alignment (except for findings at T11-T12 and T12-L1). DEGENERATIVE CHANGES: No significant degenerative changes. SOFT TISSUES: No acute abnormality. LUMBAR SPINE: BONES AND ALIGNMENT: Acute nondisplaced transverse fracture through the L1 vertebral body superior aspect. Suspected spinous process widening at the T12-L1 level. Asymmetric alignment of the articular facets at the T12-L1 level. No pedicle fracture or lamina fracture. No joint facets. No perched facets. Normal vertebral body heights (except L1). Normal  alignment (except for findings at T12-L1). DEGENERATIVE CHANGES: No significant degenerative changes. SOFT TISSUES: No acute abnormality. IMPRESSION: 1. Acute minimally displaced superior anterior corner T12 fracture with extension through the superior aspect of the T12 vertebral body. 2. Acute nondisplaced transverse fracture through the superior aspect of the L1 vertebral body. 3. Suspected spinous process widening at the T11T12 and T12L1 levels. Asymmetric alignment of the T12L1 facet joints without perched facets. Concern for posterior tension band disruption, recommend MRI thoracolumbar spine for further evaluation. Electronically signed by: Morgane Naveau MD 07/09/2024 05:58 PM EST RP Workstation: [id number]   CT CERVICAL SPINE WO CONTRAST Result Date: 07/09/2024 EXAM: CT CERVICAL SPINE WITHOUT CONTRAST 07/09/2024 05:36:37 PM TECHNIQUE: CT of the cervical spine was performed without the administration of intravenous contrast. Multiplanar reformatted images are provided for review. Automated exposure control, iterative reconstruction, and/or weight based adjustment of the mA/kV was utilized to reduce the radiation dose to as low as reasonably achievable. COMPARISON: None available. CLINICAL HISTORY: Polytrauma, blunt. FINDINGS: CERVICAL SPINE: BONES AND  ALIGNMENT: Slight straightening of the normal cervical lordosis likely due to positioning. No acute fracture or traumatic malalignment. DEGENERATIVE CHANGES: Multilevel mild degenerative changes of the spine. SOFT TISSUES: No prevertebral soft tissue swelling. LUNGS: No definite apical pneumothorax. Biopsy for pleural/pulmonary scarring. IMPRESSION: 1. No acute abnormality of the cervical spine. Electronically signed by: Morgane Naveau MD 07/09/2024 05:44 PM EST RP Workstation: [id number]   CT HEAD WO CONTRAST Result Date: 07/09/2024 EXAM: CT HEAD WITHOUT CONTRAST 07/09/2024 05:36:37 PM TECHNIQUE: CT of the head was performed without the administration of intravenous contrast. Automated exposure control, iterative reconstruction, and/or weight based adjustment of the mA/kV was utilized to reduce the radiation dose to as low as reasonably achievable. COMPARISON: None available. CLINICAL HISTORY: Head trauma, moderate-severe. FINDINGS: BRAIN AND VENTRICLES: High right frontal hypodensity within the white matter likely chronic microvascular ischemic changes. No acute hemorrhage. No evidence of acute infarct. No hydrocephalus. No extra-axial collection. No mass effect or midline shift. ORBITS: No acute abnormality. SINUSES: No acute abnormality. SOFT TISSUES AND SKULL: No acute soft tissue abnormality. No skull fracture. IMPRESSION: 1. No acute intracranial abnormality. Electronically signed by: Morgane Naveau MD 07/09/2024 05:41 PM EST RP Workstation: [id number]   DG Pelvis Portable Result Date: 07/09/2024 CLINICAL DATA:  Clemens off horse. EXAM: PORTABLE PELVIS 1-2 VIEWS COMPARISON:  None Available. FINDINGS: There is no evidence of an acute pelvic fracture or diastasis. A chronic appearing deformity is suspected within the region of the symphysis pubis on the left. No pelvic bone lesions are seen. IMPRESSION: 1. No acute fracture or diastasis. 2. Chronic appearing deformity within the region of the symphysis  pubis on the left. Correlation with physical examination is recommended to determine the presence of point tenderness. Electronically Signed   By: Suzen Dials M.D.   On: 07/09/2024 17:00   DG Chest Port 1 View Result Date: 07/09/2024 CLINICAL DATA:  Clemens off of a horse. EXAM: PORTABLE CHEST 1 VIEW COMPARISON:  None Available. FINDINGS: The heart size and mediastinal contours are within normal limits. Both lungs are clear. The visualized skeletal structures are unremarkable. IMPRESSION: No active disease. Electronically Signed   By: Suzen Dials M.D.   On: 07/09/2024 16:58      Assessment/Plan 43 yo female s/p fall from horse. - T12/L1 fractures: No ligamentous injuries on MRI. Neurosurgery consulted, recommending TLSO brace. - S5 fracture: EDP discussed with ortho, no interventions recommended - Multimodal pain control: prn dilaudid and oxycodon; scheduled tylenol , robaxin, ibuprofen   and gabapentin. - Mobilize once TLSO obtained. PT ordered - Incidental subcentimeter lesion in caudate lobe of liver - plan for outpatient interval liver MRI to further characterize. - FEN: Regular diet - VTE: lovenox, SCDs - Dispo: admit to observation. Discharge home when able to mobilize and pain controlled.   Leonor Dawn, MD [facility] Surgery General, Hepatobiliary and Pancreatic Surgery 07/10/24 4:53 AM

## 2024-07-10 NOTE — ED Notes (Signed)
 Patient moved from ED stretcher to hospital bed using back board.

## 2024-07-10 NOTE — Consult Note (Signed)
 Orthopedic Surgery H&P Note  Assessment: Patient is a 43 y.o. female with transverse sacral fracture caudal to the SI joint   Plan: -Spine fractures per neurosurgery -Sacral fracture can be treated non-operatively -Could use a foam donut when sitting to offload the area -Weight bearing as tolerated bilateral lower extremities -Okay for diet from orthopedic perspective -PT evaluate and treat -Pain control -Dispo: per primary  ___________________________________________________________________________   Reason for consult: sacral fracture  History:  Patient is a 43 y.o. female who had a fall off of a horse yesterday.  Noted immediate onset of mid back and lower back pain.  Workup was performed in the ER.  Neurosurgery was consulted for her spine fractures.  Orthopedics was consulted for her sacral fracture.  She got admitted to trauma overnight.  She is still reporting back pain and lower back pain around the tailbone.  No pain in the extremities.   Physical Exam:  General: no acute distress, appears stated age Neurologic: alert, answering questions appropriately, following commands Cardiovascular: regular rate, no cyanosis Respiratory: unlabored breathing on room air, symmetric chest rise Psychiatric: appropriate affect, normal cadence to speech  MSK:   -Bilateral upper extremities  No tenderness to palpation over extremity, no gross deformity, no open wounds seen Fires deltoid, biceps, triceps, wrist extensors, wrist flexors, finger extensors, finger flexors  AIN/PIN/IO intact  Palpable radial pulse  Sensation intact to light touch in median/ulnar/radial/axillary nerve distributions  Hand warm and well perfused  -Bilateral lower extremities  No tenderness to palpation over extremity, no pain with log roll, no gross deformity, no open wounds seen Fires hip flexors, quadriceps, hamstrings, tibialis anterior, gastrocnemius and soleus, extensor hallucis longus Plantarflexes  and dorsiflexes toes Sensation intact to light touch in sural, saphenous, tibial, deep peroneal, and superficial peroneal nerve distributions Foot warm and well perfused  Imaging: CT of the pelvis from 07/09/2024 was independently reviewed and interpreted, showing a transverse sacral fracture through S5.  The fracture is minimally displaced with kyphotic angulation.  No other pelvic fracture seen.  No dislocation in the pelvis seen.   Patient name: Mary Frye Patient MRN: [id number] Date: 07/10/24

## 2024-07-10 NOTE — Progress Notes (Signed)
 Pt said that she left a pair of earrings ( placed inside a pink/red container) downstairs either in MRI or ED-06. Called MRI, said they can't find it there. Called ED and spoke with CN Warren, said she will look and will call [facility] for update.

## 2024-07-10 NOTE — Progress Notes (Signed)
 Patient ID: Mary Frye, female   DOB: November 05, 1980, 43 y.o.   MRN: [id number] Complains of back pain and sacral pain.  The sacral pain is worse.  She is best lying flat with her knees flexed.  No leg pain or numbness tingling or weakness.  Awaiting therapy.  She has the brace in the room.  Will get upright x-rays in the brace.  MRI look good with no evidence of ligamentous injury or stenosis.

## 2024-07-10 NOTE — ED Notes (Signed)
 [facility]N ready to receive patient.

## 2024-07-10 NOTE — Progress Notes (Signed)
 Transition of Care [facility]) - CAGE-AID Screening   Patient Details  Name: Mary Frye MRN: [id number] Date of Birth: 12-09-1980   [name], RN Trauma Response Nurse Phone Number: [phone] 07/10/2024, 4:28 PM    CAGE-AID Screening:    Have You Ever Felt You Ought to Cut Down on Your Drinking or Drug Use?: No Have People Annoyed You By Critizing Your Drinking Or Drug Use?: No Have You Felt Bad Or Guilty About Your Drinking Or Drug Use?: No Have You Ever Had a Drink or Used Drugs First Thing In The Morning to Steady Your Nerves or to Get Rid of a Hangover?: No CAGE-AID Score: 0  Substance Abuse Education Offered: (S) No (Declines services- drinks only socially)

## 2024-07-10 NOTE — Evaluation (Signed)
 Physical Therapy Evaluation Patient Details Name: Mary Frye MRN: [id number] DOB: May 17, 1981 Today's Date: 07/10/2024  History of Present Illness  43 year old female admitted s/p thrown from a horse earlier today.  Imaging suggested a fracture at T12 and L1 and minimally displaced sacral fracture.  Neuro ordered TLSO brace.  PMH significant for previous concussion, o/w unremarkable.  Clinical Impression  Patient presents with dependencies in gait and mobility.  Patient mostly limited by pain at this time.  Pt is PTA and husband is OT so they have knowledge of what to expect and precautions.  Feel patient will progress steadily as pain decreases/is controlled.  PT will follow while in hospital.          If plan is discharge home, recommend the following: A little help with walking and/or transfers;A little help with bathing/dressing/bathroom;Assistance with cooking/housework;Help with stairs or ramp for entrance;Assist for transportation   Can travel by private vehicle        Equipment Recommendations None recommended by PT (pt reports she has RW and BSC)  Recommendations for Other Services  OT consult    Functional Status Assessment Patient has had a recent decline in their functional status and demonstrates the ability to make significant improvements in function in a reasonable and predictable amount of time.     Precautions / Restrictions Precautions Precautions: Back Precaution Booklet Issued: No Recall of Precautions/Restrictions: Intact Required Braces or Orthoses: Spinal Brace Spinal Brace: Thoracolumbosacral orthotic Restrictions Weight Bearing Restrictions Per Provider Order: No      Mobility  Bed Mobility Overal bed mobility: Needs Assistance Bed Mobility: Rolling, Sidelying to Sit, Sit to Sidelying Rolling: Contact guard assist Sidelying to sit: Mod assist     Sit to sidelying: Mod assist      Transfers Overall transfer level: Needs  assistance Equipment used: Rolling walker (2 wheels) Transfers: Sit to/from Stand Sit to Stand: Min assist                Ambulation/Gait Ambulation/Gait assistance: Contact guard assist Gait Distance (Feet): 15 Feet Assistive device: Rolling walker (2 wheels) Gait Pattern/deviations: Step-through pattern, Decreased stride length, Decreased weight shift to right, Decreased weight shift to left Gait velocity: decreased     General Gait Details: pt with less pain during gait initially, pain increasing the longer she stood.  Stairs            Wheelchair Mobility     Tilt Bed    Modified Rankin (Stroke Patients Only)       Balance Overall balance assessment: Mild deficits observed, not formally tested                                           Pertinent Vitals/Pain Pain Assessment Pain Assessment: 0-10 Pain Score: 10-Worst pain ever Pain Location: sacrum and back (sacrum greater than back) Pain Descriptors / Indicators: Crushing, Crying, Grimacing, Guarding, Moaning, Stabbing Pain Intervention(s): Limited activity within patient's tolerance, Monitored during session, Premedicated before session, Repositioned    Home Living Family/patient expects to be discharged to:: Private residence Living Arrangements: Spouse/significant other Available Help at Discharge: Family;Available 24 hours/day;Neighbor             Home Equipment: Rolling Walker (2 wheels);BSC/3in1 Additional Comments: Currently pt is living at hotel as her house is undergoing repair from plumbing leak.  They do have another home in which they may stay  at after discharge.  Pt reports they can likely get any needed equipment from church/friends.    Prior Function Prior Level of Function : Independent/Modified Independent;Driving                     Extremity/Trunk Assessment   Upper Extremity Assessment Upper Extremity Assessment: Overall WFL for tasks assessed (no  resistance given)    Lower Extremity Assessment Lower Extremity Assessment: Overall WFL for tasks assessed (no resistance given)    Cervical / Trunk Assessment Cervical / Trunk Assessment: Normal  Communication        Cognition Arousal: Alert Behavior During Therapy: WFL for tasks assessed/performed   PT - Cognitive impairments: No apparent impairments                                 Cueing       General Comments      Exercises     Assessment/Plan    PT Assessment Patient needs continued PT services  PT Problem List Decreased activity tolerance;Decreased mobility;Pain       PT Treatment Interventions DME instruction;Gait training;Stair training;Functional mobility training;Therapeutic activities;Therapeutic exercise;Balance training;Patient/family education    PT Goals (Current goals can be found in the Care Plan section)  Acute Rehab PT Goals Patient Stated Goal: be able to move without pain PT Goal Formulation: With patient/family Time For Goal Achievement: 07/17/24 Potential to Achieve Goals: Good    Frequency Min 5X/week     Co-evaluation               AM-PAC PT 6 Clicks Mobility  Outcome Measure Help needed turning from your back to your side while in a flat bed without using bedrails?: A Lot Help needed moving from lying on your back to sitting on the side of a flat bed without using bedrails?: A Lot Help needed moving to and from a bed to a chair (including a wheelchair)?: A Lot Help needed standing up from a chair using your arms (e.g., wheelchair or bedside chair)?: A Little Help needed to walk in hospital room?: A Little Help needed climbing 3-5 steps with a railing? : A Lot 6 Click Score: 14    End of Session Equipment Utilized During Treatment: Back brace Activity Tolerance: Patient limited by pain Patient left: in bed;with call bell/phone within reach;with family/visitor present   PT Visit Diagnosis: Unsteadiness on  feet (R26.81);Other abnormalities of gait and mobility (R26.89);Pain    Time: 1100-1149 PT Time Calculation (min) (ACUTE ONLY): 49 min   Charges:   PT Evaluation $PT Eval Moderate Complexity: 1 Mod PT Treatments $Gait Training: 23-37 mins PT General Charges $$ ACUTE PT VISIT: 1 Visit         07/10/2024 Lebron, PT Acute Rehabilitation Services Office:  [phone]   Katharina Venus HERO 07/10/2024, 12:19 PM

## 2024-07-10 NOTE — Progress Notes (Signed)
 Orthopedic Tech Progress Note Patient Details:  Mary Frye Oct 12, 1980 [id number]  Ortho Devices Type of Ortho Device: Thoracolumbar corset (TLSO) Ortho Device/Splint Location: back Ortho Device/Splint Interventions: Other (comment)   Post Interventions Patient Tolerated: Other (comment) Instructions Provided: Other (comment) Called by rn to give patient TLSO. Patient stated that she wanted to wait a bit to let pain meds settle and to eat. Delivered and placed by bedside. Please page ortho if help needed adjusting TLSO.  [name] 07/10/2024, 8:49 AM

## 2024-07-11 MED ORDER — SENNOSIDES-DOCUSATE SODIUM 8.6-50 MG PO TABS
1.0000 | ORAL_TABLET | Freq: Two times a day (BID) | ORAL | Status: DC
  Administered 2024-07-11 – 2024-07-13 (×5): 1 via ORAL
  Filled 2024-07-11 (×5): qty 1

## 2024-07-11 MED ORDER — POLYETHYLENE GLYCOL 3350 17 G PO PACK
17.0000 g | PACK | Freq: Two times a day (BID) | ORAL | Status: DC
  Administered 2024-07-11 – 2024-07-13 (×4): 17 g via ORAL
  Filled 2024-07-11 (×4): qty 1

## 2024-07-11 MED ORDER — METHOCARBAMOL 500 MG PO TABS
1000.0000 mg | ORAL_TABLET | Freq: Four times a day (QID) | ORAL | Status: DC
  Administered 2024-07-11 – 2024-07-13 (×8): 1000 mg via ORAL
  Filled 2024-07-11 (×8): qty 2

## 2024-07-11 MED ORDER — LIDOCAINE 5 % EX PTCH
2.0000 | MEDICATED_PATCH | CUTANEOUS | Status: DC
  Administered 2024-07-11 – 2024-07-13 (×3): 2 via TRANSDERMAL
  Filled 2024-07-11 (×3): qty 2

## 2024-07-11 NOTE — Progress Notes (Signed)
 [facility] Progress Note     Subjective: CC:  Reports low back pain worse when getting OOB. Once she is standing she says she feels steady. Pain worse this morning after going many hours overnight without medication. Voiding ok. Has not had a BM and expresses concern about pain during BM. No N/V.   At baseline she lives at home with her husband and 3 children. They have been living in a hotel while their bathroom is under holiday representative. She tells me that her mother lives on a farm and she may go stay with her mom at discharge.  Reports a lot of church/friend/family support.  Objective: Vital signs in last 24 hours: Temp:  [97.7 F (36.5 C)-98.6 F (37 C)] 98 F (36.7 C) (11/17 0658) Pulse Rate:  [76-100] 100 (11/17 0658) Resp:  [17-18] 18 (11/17 0658) BP: (111-123)/(65-75) 115/71 (11/17 0658) SpO2:  [95 %-99 %] 96 % (11/17 0658) Last BM Date : 07/09/24  Intake/Output from previous day: 11/16 0701 - 11/17 0700 In: 120 [P.O.:120] Out: 900 [Urine:900] Intake/Output this shift: No intake/output data recorded.  PE: Gen:  Alert, NAD, pleasant Card:  Regular rate and rhythm, no lower extremity edema  Pulm:  Normal effort ORA Abd: Soft, non-tender, non-distended Skin: warm and dry, no rashes  Psych: A&Ox3   Lab Results:  Recent Labs    07/09/24 1634 07/09/24 1641  WBC 11.1*  --   HGB 13.7 13.6  HCT 39.6 40.0  PLT 310  --    BMET Recent Labs    07/09/24 1634 07/09/24 1641  NA 138 136  K 4.2 4.5  CL 108 108  CO2 18*  --   GLUCOSE 96 95  BUN 11 13  CREATININE 0.85 0.80  CALCIUM 9.1  --    PT/INR Recent Labs    07/09/24 1634  LABPROT 13.6  INR 1.0   CMP     Component Value Date/Time   NA 136 07/09/2024 1641   K 4.5 07/09/2024 1641   CL 108 07/09/2024 1641   CO2 18 (L) 07/09/2024 1634   GLUCOSE 95 07/09/2024 1641   BUN 13 07/09/2024 1641   CREATININE 0.80 07/09/2024 1641   CALCIUM 9.1 07/09/2024 1634   PROT 6.9 07/09/2024 1634   ALBUMIN  4.0 07/09/2024 1634   AST 33 07/09/2024 1634   ALT 22 07/09/2024 1634   ALKPHOS 59 07/09/2024 1634   BILITOT 0.8 07/09/2024 1634   GFRNONAA >60 07/09/2024 1634   Lipase  No results found for: LIPASE     Studies/Results: DG THORACOLUMBAR SPINE Result Date: 07/10/2024 EXAM: 2 VIEW(S) XRAY OF THE THORACOLUMBAR SPINE 07/10/2024 02:06:00 PM COMPARISON: CT 07/09/2024. CLINICAL HISTORY: [id number] Closed L1 vertebral fracture (HCC) 374582 Closed L1 vertebral fracture (HCC) FINDINGS: THORACIC SPINE: BONES: Upper thoracic spine excluded. Mild compression fracture deformity anteriorly at T12 with less than 10% loss of height. Alignment is preserved. DISCS AND DEGENERATIVE CHANGES: No severe degenerative changes. SOFT TISSUES: The visualized lungs appear clear. LUMBAR SPINE: BONES: Mild compression fracture deformity anteriorly at L1 with no significant loss of height. Alignment is preserved. DISCS AND DEGENERATIVE CHANGES: No severe degenerative changes. SOFT TISSUES: No acute abnormality. IMPRESSION: 1. Mild anterior compression fractures at T12 and L1, with less than 10% height loss at T12 and no significant height loss at L1. Alignment preserved. Electronically signed by: Dayne Hassell MD 07/10/2024 05:44 PM EST RP Workstation: [id number]   MR LUMBAR SPINE WO CONTRAST Result Date: 07/09/2024 EXAM: MRI Thoracic and Lumbar Spine  Without Intravenous Contrast 07/09/2024 09:14:08 PM TECHNIQUE: Multiplanar multisequence MRI of the thoracic and lumbar spine was performed without the administration of intravenous contrast. COMPARISON: None available. CLINICAL HISTORY: Back trauma, abnormal neuro exam, CT or xray positive (Age >= 16y); Widening of facet joints on CT, fall from horse FINDINGS: BONES AND ALIGNMENT: Mild S shaped thoracolumbar curvature.  No substantial subluxation. Acute T12 and L1 superior endplate fractures which extends posteriorly through the vertebral body to the posterior cortex. SPINAL CORD:  Normal spinal cord size. Normal spinal cord signal. SOFT TISSUES: Mild paraspinal edema anterior to T12. No posterior paraspinal/interspinous edema or specific evidence of ligamentous injury. THORACIC DISC LEVELS: No significant disc herniation. No spinal canal stenosis or neural foraminal narrowing. L1-L2: No significant disc herniation. No spinal canal stenosis or neural foraminal narrowing. L2-L3: No significant disc herniation. No spinal canal stenosis or neural foraminal narrowing. L3-L4: Mild broad disc bulge with Central disc protrusion. Mild spinal canal stenosis. No significant neural foraminal narrowing. L4-L5: Left eccentric disc bulge. Mild left subarticular recess stenosis. No spinal canal stenosis or neural foraminal narrowing. L5-S1: Mild broad disc bulge with Central disc protrusion. Mild bilateral subarticular recess and foraminal stenosis.  Patent canal. IMPRESSION: 1. Acute T12 and L1 superior endplate fractures which extend posteriorly through the vertebral body to the posterior vertebral body cortex. 2. Mild adjacent paraspinal edema anteriorly at T12. No posterior paraspinal/interspinous edema or specific evidence of ligamentous injury. Electronically signed by: Gilmore Molt MD 07/09/2024 10:37 PM EST RP Workstation: [id number]   MR THORACIC SPINE WO CONTRAST Result Date: 07/09/2024 EXAM: MRI Thoracic and Lumbar Spine Without Intravenous Contrast 07/09/2024 09:14:08 PM TECHNIQUE: Multiplanar multisequence MRI of the thoracic and lumbar spine was performed without the administration of intravenous contrast. COMPARISON: None available. CLINICAL HISTORY: Back trauma, abnormal neuro exam, CT or xray positive (Age >= 16y); Widening of facet joints on CT, fall from horse FINDINGS: BONES AND ALIGNMENT: Mild S shaped thoracolumbar curvature.  No substantial subluxation. Acute T12 and L1 superior endplate fractures which extends posteriorly through the vertebral body to the posterior cortex.  SPINAL CORD: Normal spinal cord size. Normal spinal cord signal. SOFT TISSUES: Mild paraspinal edema anterior to T12. No posterior paraspinal/interspinous edema or specific evidence of ligamentous injury. THORACIC DISC LEVELS: No significant disc herniation. No spinal canal stenosis or neural foraminal narrowing. L1-L2: No significant disc herniation. No spinal canal stenosis or neural foraminal narrowing. L2-L3: No significant disc herniation. No spinal canal stenosis or neural foraminal narrowing. L3-L4: Mild broad disc bulge with Central disc protrusion. Mild spinal canal stenosis. No significant neural foraminal narrowing. L4-L5: Left eccentric disc bulge. Mild left subarticular recess stenosis. No spinal canal stenosis or neural foraminal narrowing. L5-S1: Mild broad disc bulge with Central disc protrusion. Mild bilateral subarticular recess and foraminal stenosis.  Patent canal. IMPRESSION: 1. Acute T12 and L1 superior endplate fractures which extend posteriorly through the vertebral body to the posterior vertebral body cortex. 2. Mild adjacent paraspinal edema anteriorly at T12. No posterior paraspinal/interspinous edema or specific evidence of ligamentous injury. Electronically signed by: Gilmore Molt MD 07/09/2024 10:37 PM EST RP Workstation: [id number]   CT T-SPINE NO CHARGE Result Date: 07/09/2024 EXAM: CT THORACIC AND LUMBAR SPINE WITH INTRAVENOUS CONTRAST 07/09/2024 05:36:37 PM TECHNIQUE: CT OF THE THORACIC AND LUMBAR SPINE WAS PERFORMED WITH THE ADMINISTRATION OF 75 ML OF IOHEXOL (OMNIPAQUE) 350 MG/ML INJECTION. MULTIPLANAR REFORMATTED IMAGES ARE PROVIDED FOR REVIEW. AUTOMATED EXPOSURE CONTROL, ITERATIVE RECONSTRUCTION, AND/OR WEIGHT BASED ADJUSTMENT OF THE MA/KV WAS UTILIZED TO REDUCE  THE RADIATION DOSE TO AS LOW AS REASONABLY ACHIEVABLE. INCIDENTAL ADRENAL AND/OR RENAL FINDINGS DO NOT REQUIRE FOLLOW UP IMAGING. COMPARISON: NONE AVAILABLE. CLINICAL HISTORY: FINDINGS: THORACIC SPINE: BONES AND  ALIGNMENT: ACUTE MINIMALLY DISPLACED SUPERIOR ANTERIOR CORNER T12 FRACTURE WITH A NONDISPLACED FRACTURE EXTENDING THROUGH THE SUPERIOR ASPECT OF THE T12 VERTEBRAL BODY. SUSPECTED SPINOUS PROCESS WIDENING AT THE T11-T12 AND T12-L1 LEVELS. ASYMMETRIC ALIGNMENT OF THE ARTICULAR FACETS AT THE T12-L1 LEVEL. NO PEDICLE FRACTURE OR LAMINA FRACTURE. NO JOINT FACETS. NO PERCHED FACETS. NORMAL VERTEBRAL BODY HEIGHTS (EXCEPT T12). NORMAL ALIGNMENT (EXCEPT FOR FINDINGS AT T11-T12 AND T12-L1). DEGENERATIVE CHANGES: NO SIGNIFICANT DEGENERATIVE CHANGES. SOFT TISSUES: NO ACUTE ABNORMALITY. LUMBAR SPINE: BONES AND ALIGNMENT: ACUTE NONDISPLACED TRANSVERSE FRACTURE THROUGH THE L1 VERTEBRAL BODY SUPERIOR ASPECT. SUSPECTED SPINOUS PROCESS WIDENING AT THE T12-L1 LEVEL. ASYMMETRIC ALIGNMENT OF THE ARTICULAR FACETS AT THE T12-L1 LEVEL. NO PEDICLE FRACTURE OR LAMINA FRACTURE. NO JOINT FACETS. NO PERCHED FACETS. NORMAL VERTEBRAL BODY HEIGHTS (EXCEPT L1). NORMAL ALIGNMENT (EXCEPT FOR FINDINGS AT T12-L1). DEGENERATIVE CHANGES: NO SIGNIFICANT DEGENERATIVE CHANGES. SOFT TISSUES: NO ACUTE ABNORMALITY. IMPRESSION: 1. ACUTE MINIMALLY DISPLACED SUPERIOR ANTERIOR CORNER T12 FRACTURE WITH EXTENSION THROUGH THE SUPERIOR ASPECT OF THE T12 VERTEBRAL BODY. 2. ACUTE NONDISPLACED TRANSVERSE FRACTURE THROUGH THE SUPERIOR ASPECT OF THE L1 VERTEBRAL BODY. 3. SUSPECTED SPINOUS PROCESS WIDENING AT THE T11T12 AND T12L1 LEVELS. ASYMMETRIC ALIGNMENT OF THE T12L1 FACET JOINTS WITHOUT PERCHED FACETS. CONCERN FOR POSTERIOR TENSION BAND DISRUPTION, RECOMMEND MRI THORACOLUMBAR SPINE FOR FURTHER EVALUATION. Electronically signed by: Kate Plummer MD 07/09/2024 06:04 PM EST RP Workstation: [id number]   CT CHEST ABDOMEN PELVIS W CONTRAST Result Date: 07/09/2024 EXAM: CT CHEST, ABDOMEN AND PELVIS WITH CONTRAST 07/09/2024 05:36:37 PM TECHNIQUE: CT of the chest, abdomen and pelvis was performed with the administration of 75 mL of iohexol (OMNIPAQUE) 350 MG/ML  injection. Multiplanar reformatted images are provided for review. Automated exposure control, iterative reconstruction, and/or weight based adjustment of the mA/kV was utilized to reduce the radiation dose to as low as reasonably achievable. COMPARISON: None available. CLINICAL HISTORY: Polytrauma, blunt. FINDINGS: CHEST: MEDIASTINUM AND LYMPH NODES: Heart and pericardium are unremarkable. The central airways are clear. No mediastinal, hilar or axillary lymphadenopathy. LUNGS AND PLEURA: No focal consolidation or pulmonary edema. No pleural effusion or pneumothorax. ABDOMEN AND PELVIS: LIVER: Subcentimeter hypodensity of the caudate lobe, too small to characterize. GALLBLADDER AND BILE DUCTS: Gallbladder is unremarkable. No biliary ductal dilatation. SPLEEN: No acute abnormality. PANCREAS: No acute abnormality. ADRENAL GLANDS: No acute abnormality. KIDNEYS, URETERS AND BLADDER: No stones in the kidneys or ureters. No hydronephrosis. No perinephric or periureteral stranding. Urinary bladder is unremarkable. GI AND BOWEL: Stomach demonstrates no acute abnormality. No small or large bowel wall thickening or dilatation. The appendix is unremarkable. Colonic diverticulosis. There is no bowel obstruction. REPRODUCTIVE ORGANS: The uterus is unremarkable. No adnexal mass. Right ovarian corpus luteum cysts x 2 - no further follow-up indicated. PERITONEUM AND RETROPERITONEUM: No ascites. No free air. No mesenteric hematoma. VASCULATURE: Aorta is normal in caliber. ABDOMINAL AND PELVIS LYMPH NODES: No lymphadenopathy. BONES AND SOFT TISSUES: No acute displaced rib fracture. No acute displaced sternal fracture. No acute displaced scapular fracture or partially visualized clavicular fracture. No acute displaced hip or pelvic fracture. No hip dislocation. No pelvic diastasis. Mild midline subcutaneous soft tissue hematoma at the level of the lower sacrum with associated acute anteriorly displaced fracture of the S5 level.  Fracture extends to the lumbosacral foramina. Coccyx is unremarkable. Please see separately dictated CT thoracolumbar spine at 07/09/2024. IMPRESSION: 1. Acute anteriorly  displaced S5 fracture with associated mild midline subcutaneous soft tissue hematoma; fracture extends to the lumbosacral foramina. 2. No additional acute displaced fractures of the chest, abdomen, or pelvis. 3. Please see separately dictated CT thoracolumbar spine at 07/09/2024. Electronically signed by: Morgane Naveau MD 07/09/2024 05:59 PM EST RP Workstation: [id number]   CT L-SPINE NO CHARGE Result Date: 07/09/2024 EXAM: CT THORACIC AND LUMBAR SPINE WITH INTRAVENOUS CONTRAST 07/09/2024 05:36:37 PM TECHNIQUE: CT of the thoracic and lumbar spine was performed with the administration of 75 mL of iohexol (OMNIPAQUE) 350 MG/ML injection. Multiplanar reformatted images are provided for review. Automated exposure control, iterative reconstruction, and/or weight based adjustment of the mA/kV was utilized to reduce the radiation dose to as low as reasonably achievable. Incidental adrenal and/or renal findings do not require follow up imaging. COMPARISON: None available. CLINICAL HISTORY: FINDINGS: THORACIC SPINE: BONES AND ALIGNMENT: Acute minimally displaced superior anterior corner T12 fracture with a nondisplaced fracture extending through the superior aspect of the T12 vertebral body. Suspected spinous process widening at the T11-T12 and T12-L1 levels. Asymmetric alignment of the articular facets at the T12-L1 level. No pedicle fracture or lamina fracture. No joint facets. No perched facets. Normal vertebral body heights (except T12). Normal alignment (except for findings at T11-T12 and T12-L1). DEGENERATIVE CHANGES: No significant degenerative changes. SOFT TISSUES: No acute abnormality. LUMBAR SPINE: BONES AND ALIGNMENT: Acute nondisplaced transverse fracture through the L1 vertebral body superior aspect. Suspected spinous process widening at  the T12-L1 level. Asymmetric alignment of the articular facets at the T12-L1 level. No pedicle fracture or lamina fracture. No joint facets. No perched facets. Normal vertebral body heights (except L1). Normal alignment (except for findings at T12-L1). DEGENERATIVE CHANGES: No significant degenerative changes. SOFT TISSUES: No acute abnormality. IMPRESSION: 1. Acute minimally displaced superior anterior corner T12 fracture with extension through the superior aspect of the T12 vertebral body. 2. Acute nondisplaced transverse fracture through the superior aspect of the L1 vertebral body. 3. Suspected spinous process widening at the T11T12 and T12L1 levels. Asymmetric alignment of the T12L1 facet joints without perched facets. Concern for posterior tension band disruption, recommend MRI thoracolumbar spine for further evaluation. Electronically signed by: Morgane Naveau MD 07/09/2024 05:58 PM EST RP Workstation: [id number]   CT CERVICAL SPINE WO CONTRAST Result Date: 07/09/2024 EXAM: CT CERVICAL SPINE WITHOUT CONTRAST 07/09/2024 05:36:37 PM TECHNIQUE: CT of the cervical spine was performed without the administration of intravenous contrast. Multiplanar reformatted images are provided for review. Automated exposure control, iterative reconstruction, and/or weight based adjustment of the mA/kV was utilized to reduce the radiation dose to as low as reasonably achievable. COMPARISON: None available. CLINICAL HISTORY: Polytrauma, blunt. FINDINGS: CERVICAL SPINE: BONES AND ALIGNMENT: Slight straightening of the normal cervical lordosis likely due to positioning. No acute fracture or traumatic malalignment. DEGENERATIVE CHANGES: Multilevel mild degenerative changes of the spine. SOFT TISSUES: No prevertebral soft tissue swelling. LUNGS: No definite apical pneumothorax. Biopsy for pleural/pulmonary scarring. IMPRESSION: 1. No acute abnormality of the cervical spine. Electronically signed by: Morgane Naveau MD 07/09/2024  05:44 PM EST RP Workstation: [id number]   CT HEAD WO CONTRAST Result Date: 07/09/2024 EXAM: CT HEAD WITHOUT CONTRAST 07/09/2024 05:36:37 PM TECHNIQUE: CT of the head was performed without the administration of intravenous contrast. Automated exposure control, iterative reconstruction, and/or weight based adjustment of the mA/kV was utilized to reduce the radiation dose to as low as reasonably achievable. COMPARISON: None available. CLINICAL HISTORY: Head trauma, moderate-severe. FINDINGS: BRAIN AND VENTRICLES: High right frontal hypodensity within the white matter  likely chronic microvascular ischemic changes. No acute hemorrhage. No evidence of acute infarct. No hydrocephalus. No extra-axial collection. No mass effect or midline shift. ORBITS: No acute abnormality. SINUSES: No acute abnormality. SOFT TISSUES AND SKULL: No acute soft tissue abnormality. No skull fracture. IMPRESSION: 1. No acute intracranial abnormality. Electronically signed by: Morgane Naveau MD 07/09/2024 05:41 PM EST RP Workstation: [id number]   DG Pelvis Portable Result Date: 07/09/2024 CLINICAL DATA:  Clemens off horse. EXAM: PORTABLE PELVIS 1-2 VIEWS COMPARISON:  None Available. FINDINGS: There is no evidence of an acute pelvic fracture or diastasis. A chronic appearing deformity is suspected within the region of the symphysis pubis on the left. No pelvic bone lesions are seen. IMPRESSION: 1. No acute fracture or diastasis. 2. Chronic appearing deformity within the region of the symphysis pubis on the left. Correlation with physical examination is recommended to determine the presence of point tenderness. Electronically Signed   By: Suzen Dials M.D.   On: 07/09/2024 17:00   DG Chest Port 1 View Result Date: 07/09/2024 CLINICAL DATA:  Clemens off of a horse. EXAM: PORTABLE CHEST 1 VIEW COMPARISON:  None Available. FINDINGS: The heart size and mediastinal contours are within normal limits. Both lungs are clear. The visualized  skeletal structures are unremarkable. IMPRESSION: No active disease. Electronically Signed   By: Suzen Dials M.D.   On: 07/09/2024 16:58    Anti-infectives: Anti-infectives (From admission, onward)    None        Assessment/Plan  43 y/o F s/p fall from horse  T12/L1 fractures - No ligamentous injuries on MRI. Upright films ok. TLSO. Outpatient follow up with Dr. Joshua in 2 weeks. May remove brace to shower.  Incidental subcentimeter lesion in caudate lobe of liver - plan for outpatient interval liver MRI to further characterize.   Pain control: tylenol  1,000 mg QID, increase robaxin to 1,000 mg QID, add lidoderm  patch, PRN oxy  FEN: reg, senna-S BID, miralax BID VTE: SCD's, Lovenox  Dispo: PT/OT and work on pain control today. Hopefully home tomorrow if not needing IV pain meds and mobilizing safely.    LOS: 1 day   I reviewed nursing notes, last 24 h vitals and pain scores, last 48 h intake and output, last 24 h labs and trends, and last 24 h imaging results.  This care required moderate level of medical decision making.   Almarie Pringle, PA-C [facility] Please see Amion for pager number during day hours 7:00am-4:30pm

## 2024-07-11 NOTE — Progress Notes (Signed)
 Physical Therapy Treatment Patient Details Name: Mary Frye MRN: [id number] DOB: 1980/08/27 Today's Date: 07/11/2024   History of Present Illness 43 year old female admitted s/p thrown from a horse earlier today.  Imaging suggested a fracture at T12 and L1 and minimally displaced sacral fracture.  Neuro ordered TLSO brace.  PMH significant for previous concussion, o/w unremarkable.    PT Comments  Pt is progressing well towards goals. Currently pt is supervision to Mod I for sit to stand with RW, CGA to supervision for gait with RW and CGA for stairs per mother's home set up. Pt to go stay with her mother on discharge due to construction on home. Pt has assistance from family intermittently at discharge. No overt LOB; increased pain with transitions and static sitting. Pt assisted with adjusting TLSO. Due to pt current functional status, home set up and available assistance at home recommending skilled physical therapy services 3x/week in order to address strength, balance and functional mobility to decrease risk for falls, injury and re-hospitalization.       If plan is discharge home, recommend the following: A little help with walking and/or transfers;A little help with bathing/dressing/bathroom;Assistance with cooking/housework;Help with stairs or ramp for entrance;Assist for transportation     Equipment Recommendations  None recommended by PT       Precautions / Restrictions Precautions Precautions: Back Precaution Booklet Issued: No Recall of Precautions/Restrictions: Intact Required Braces or Orthoses: Spinal Brace Spinal Brace: Thoracolumbosacral orthotic Restrictions Weight Bearing Restrictions Per Provider Order: No     Mobility  Bed Mobility   General bed mobility comments: sitting up in recliner on arrival and departure    Transfers Overall transfer level: Modified independent Equipment used: Rolling walker (2 wheels) Transfers: Sit to/from Stand Sit to Stand:  Supervision, Modified independent (Device/Increase time)           General transfer comment: supervision provided for safety due to pain; no overt LOB    Ambulation/Gait Ambulation/Gait assistance: Supervision Gait Distance (Feet): 300 Feet Assistive device: Rolling walker (2 wheels) Gait Pattern/deviations: Step-through pattern, Decreased stride length, Decreased weight shift to right   Gait velocity interpretation: 1.31 - 2.62 ft/sec, indicative of limited community ambulator   General Gait Details: no significant increase pain with activity   Stairs Stairs: Yes Stairs assistance: Contact guard assist Stair Management: One rail Right, Forwards, Step to pattern Number of Stairs: 3 General stair comments: CGA for safety, no LOB, incr pain with descending      Balance Overall balance assessment: No apparent balance deficits (not formally assessed)      Communication Communication Communication: No apparent difficulties  Cognition Arousal: Alert Behavior During Therapy: WFL for tasks assessed/performed   PT - Cognitive impairments: No apparent impairments     Following commands: Intact      Cueing Cueing Techniques: Verbal cues     General Comments General comments (skin integrity, edema, etc.): No signs/symptoms of cardiac/respiratory distress      Pertinent Vitals/Pain Pain Assessment Pain Assessment: Faces Faces Pain Scale: Hurts little more Pain Location: sacrum and back (sacrum greater than back) Pain Descriptors / Indicators: Aching, Grimacing, Sore Pain Intervention(s): Monitored during session, Limited activity within patient's tolerance, Premedicated before session     PT Goals (current goals can now be found in the care plan section) Acute Rehab PT Goals Patient Stated Goal: be able to move without pain PT Goal Formulation: With patient/family Time For Goal Achievement: 07/17/24 Potential to Achieve Goals: Good Progress towards PT goals:  Progressing  toward goals    Frequency    Min 5X/week      PT Plan  Continue with current POC        AM-PAC PT 6 Clicks Mobility   Outcome Measure  Help needed turning from your back to your side while in a flat bed without using bedrails?: A Lot Help needed moving from lying on your back to sitting on the side of a flat bed without using bedrails?: A Lot Help needed moving to and from a bed to a chair (including a wheelchair)?: A Lot Help needed standing up from a chair using your arms (e.g., wheelchair or bedside chair)?: A Little Help needed to walk in hospital room?: A Little Help needed climbing 3-5 steps with a railing? : A Lot 6 Click Score: 14    End of Session Equipment Utilized During Treatment: Back brace;Gait belt Activity Tolerance: Patient tolerated treatment well Patient left: in chair;with call bell/phone within reach Nurse Communication: Mobility status PT Visit Diagnosis: Unsteadiness on feet (R26.81);Other abnormalities of gait and mobility (R26.89);Pain Pain - Right/Left:  (LB)     Time: 8353-8279 PT Time Calculation (min) (ACUTE ONLY): 34 min  Charges:    $Therapeutic Activity: 23-37 mins PT General Charges $$ ACUTE PT VISIT: 1 Visit                    Dorothyann Maier, DPT, CLT  Acute Rehabilitation Services Office: [phone] (Secure chat preferred)    Dorothyann VEAR Maier 07/11/2024, 5:30 PM

## 2024-07-11 NOTE — Plan of Care (Signed)
  Problem: Pain Managment: Goal: General experience of comfort will improve and/or be controlled Outcome: Progressing   Problem: Safety: Goal: Ability to remain free from injury will improve Outcome: Progressing

## 2024-07-11 NOTE — Plan of Care (Signed)
  Problem: Pain Managment: Goal: General experience of comfort will improve and/or be controlled Outcome: Progressing   Problem: Safety: Goal: Ability to remain free from injury will improve Outcome: Progressing   Problem: Pain Managment: Goal: General experience of comfort will improve and/or be controlled Outcome: Progressing

## 2024-07-11 NOTE — Evaluation (Signed)
 Occupational Therapy Evaluation Patient Details Name: Mary Frye MRN: [id number] DOB: [birth year] Today's Date: 07/11/2024   History of Present Illness   42 year old female admitted s/p thrown from a horse earlier today.  Imaging suggested a fracture at T12 and L1 and minimally displaced sacral fracture.  Neuro ordered TLSO brace.  PMH significant for previous concussion, o/w unremarkable.     Clinical Impressions Pt admitted for the above diagnosis and has the deficits listed below. Pt would benefit from cont OT to increase independence with LE adls back to baseline level of functioning which is independent. Pt most limited by pain but mobilized much better this am walking to bathroom and grooming at the sink. Pt, most likely, will be initially living with her mother at her house as her house is being renovated and she currently lives in a hotel.  Mother will be available to assist as needed. Rec HHOT at this point to ensure safety and independence in her new environment and to continue addressing LE ads.  Pt does well with good pain control.  Encouraged pt to ask nursing to wake her in middle of night to give pain meds initially so she is not in as much pain in the am when first mobilizing. Pt will need to set alarm if this is needed post d/c at home. Will continue to see.      If plan is discharge home, recommend the following:   A lot of help with bathing/dressing/bathroom;A little help with walking and/or transfers;Assistance with cooking/housework;Assist for transportation;Help with stairs or ramp for entrance     Functional Status Assessment   Patient has had a recent decline in their functional status and demonstrates the ability to make significant improvements in function in a reasonable and predictable amount of time.     Equipment Recommendations   BSC/3in1;Other (comment) (only if covered by insurance per pt report.  Told her this may not be the case.)      Recommendations for Other Services         Precautions/Restrictions   Precautions Precautions: Back Recall of Precautions/Restrictions: Intact Required Braces or Orthoses: Spinal Brace Spinal Brace: Thoracolumbosacral orthotic Restrictions Weight Bearing Restrictions Per Provider Order: No     Mobility Bed Mobility Overal bed mobility: Needs Assistance Bed Mobility: Supine to Sit     Supine to sit: Mod assist     General bed mobility comments: In order to attempt to control pain pt came to EOB in helicopter fashion. Pt was able to hold to therapist and pull herself up while turning LEs at same time to avoid the pain of rolling and coming from sidelying to sit. Pt feels this method will work better at home for pain control.    Transfers Overall transfer level: Needs assistance Equipment used: Rolling walker (2 wheels) Transfers: Sit to/from Stand Sit to Stand: Contact guard assist, From elevated surface           General transfer comment: Cues for hand placement      Balance Overall balance assessment: Mild deficits observed, not formally tested                                         ADL either performed or assessed with clinical judgement   ADL Overall ADL's : Needs assistance/impaired Eating/Feeding: Independent;Sitting   Grooming: Wash/dry hands;Wash/dry face;Oral care;Brushing hair;Applying deodorant;Supervision/safety;Standing Grooming Details (indicate cue type and  reason): pt stood at sink to groom. Instructed to spit in cup and pour out to maintain back precautions Upper Body Bathing: Set up;Sitting   Lower Body Bathing: Moderate assistance;Sit to/from stand;Cueing for compensatory techniques   Upper Body Dressing : Minimal assistance;Sitting Upper Body Dressing Details (indicate cue type and reason): assist with brace only Lower Body Dressing: Moderate assistance;Sit to/from stand;Cueing for back precautions;Cueing for  compensatory techniques Lower Body Dressing Details (indicate cue type and reason): pt in too much pain to address LE dressing after toileting. Spoke with pt about figure 4 position vs using adaptive equipment. Toilet Transfer: Contact guard assist;Ambulation;Rolling walker (2 wheels);Comfort height toilet;BSC/3in1 Toilet Transfer Details (indicate cue type and reason): Pt walked to bathroom and used 3:1 over commode to raise commode to high position for ease of transfers. Toileting- Clothing Manipulation and Hygiene: Supervision/safety;Sit to/from stand;Cueing for back precautions Toileting - Clothing Manipulation Details (indicate cue type and reason): Pt must wipe from front as it is too painful for pt to reach behind due to twisting motion. Talked to pt about the toilet aid for when bowel movements need to be cleaned. pt will consider this.     Functional mobility during ADLs: Contact guard assist;Rolling walker (2 wheels) General ADL Comments: Pt doing much better with adls this am and is more mobile. Pt continues to haVe the most pain when transitioning between movements.     Vision Baseline Vision/History: 1 Wears glasses Ability to See in Adequate Light: 0 Adequate Patient Visual Report: No change from baseline Vision Assessment?: No apparent visual deficits     Perception Perception: Within Functional Limits       Praxis Praxis: WFL       Pertinent Vitals/Pain Pain Assessment Pain Assessment: 0-10 Pain Score: 6  Pain Location: sacrum and back (sacrum greater than back) Pain Descriptors / Indicators: Aching, Grimacing, Sore Pain Intervention(s): Limited activity within patient's tolerance, Monitored during session, Repositioned, Premedicated before session     Extremity/Trunk Assessment Upper Extremity Assessment Upper Extremity Assessment: Overall WFL for tasks assessed   Lower Extremity Assessment Lower Extremity Assessment: Defer to PT evaluation   Cervical / Trunk  Assessment Cervical / Trunk Assessment: Other exceptions Cervical / Trunk Exceptions: new back and sacral fxs   Communication Communication Communication: No apparent difficulties   Cognition Arousal: Alert Behavior During Therapy: WFL for tasks assessed/performed Cognition: No apparent impairments                               Following commands: Intact       Cueing  General Comments      Pt mobilizing better today during all adls. Pain appeared to be under better control as pt walked to bathroom and complete adls at sink.   Exercises     Shoulder Instructions      Home Living Family/patient expects to be discharged to:: Private residence Living Arrangements: Spouse/significant other Available Help at Discharge: Family;Available 24 hours/day;Neighbor Type of Home: House Home Access: Stairs to enter [employer] of Steps: 3 Entrance Stairs-Rails: Right Home Layout: One level     Bathroom Shower/Tub: Tub/shower unit;Curtain   Bathroom Toilet: Handicapped height     Home Equipment: [employer] (2 wheels);BSC/3in1;Other (comment) (bedrail)   Additional Comments: Currently pt is living at hotel as her house is undergoing repair from plumbing leak.  They do have another home in which they may stay at after discharge.  Pt reports they  can likely get any needed equipment from church/friends. Pt will most likely live with mother.      Prior Functioning/Environment Prior Level of Function : Independent/Modified Independent;Driving             Mobility Comments: independent ADLs Comments: independent    OT Problem List: Impaired balance (sitting and/or standing);Decreased knowledge of use of DME or AE;Decreased knowledge of precautions;Pain   OT Treatment/Interventions: Self-care/ADL training;DME and/or AE instruction;Therapeutic activities;Balance training      OT Goals(Current goals can be found in the care plan section)   Acute  Rehab OT Goals Patient Stated Goal: to have less pain when moving OT Goal Formulation: With patient Time For Goal Achievement: 07/25/24 Potential to Achieve Goals: Good ADL Goals Pt Will Perform Lower Body Bathing: with modified independence;with adaptive equipment;sit to/from stand Pt Will Perform Lower Body Dressing: with modified independence;with adaptive equipment;sit to/from stand Pt Will Perform Tub/Shower Transfer: Tub transfer;with supervision;ambulating;3 in 1;rolling walker Additional ADL Goal #1: Pt will walk to bathroom and complete all toileting using walker and 3:1 over commode with mod I   OT Frequency:  Min 2X/week    Co-evaluation              AM-PAC OT 6 Clicks Daily Activity     Outcome Measure Help from another person eating meals?: None Help from another person taking care of personal grooming?: A Little Help from another person toileting, which includes using toliet, bedpan, or urinal?: A Little Help from another person bathing (including washing, rinsing, drying)?: A Lot Help from another person to put on and taking off regular upper body clothing?: A Little Help from another person to put on and taking off regular lower body clothing?: A Lot 6 Click Score: 17   End of Session Equipment Utilized During Treatment: Rolling walker (2 wheels);Back brace Nurse Communication: Mobility status  Activity Tolerance: Patient tolerated treatment well Patient left: in chair;with call bell/phone within reach  OT Visit Diagnosis: Unsteadiness on feet (R26.81);Pain Pain - Right/Left: Right Pain - part of body:  (buttocks and back)                Time: 9198-9152 OT Time Calculation (min): 46 min Charges:  OT General Charges $OT Visit: 1 Visit OT Evaluation $OT Eval Moderate Complexity: 1 Mod OT Treatments $Self Care/Home Management : 23-37 mins  Joshua Silvano Dragon 07/11/2024, 9:04 AM

## 2024-07-12 LAB — MISC LABCORP TEST (SEND OUT): Labcorp test code: 83935

## 2024-07-12 MED ORDER — OXYCODONE HCL 5 MG PO TABS
10.0000 mg | ORAL_TABLET | ORAL | Status: DC | PRN
  Administered 2024-07-12 – 2024-07-13 (×6): 15 mg via ORAL
  Filled 2024-07-12 (×6): qty 3

## 2024-07-12 MED ORDER — IBUPROFEN 600 MG PO TABS
600.0000 mg | ORAL_TABLET | Freq: Three times a day (TID) | ORAL | Status: DC
  Administered 2024-07-12 – 2024-07-13 (×5): 600 mg via ORAL
  Filled 2024-07-12 (×5): qty 1

## 2024-07-12 MED ORDER — MAGNESIUM CITRATE PO SOLN
0.5000 | Freq: Once | ORAL | Status: AC
  Administered 2024-07-12: 0.5 via ORAL
  Filled 2024-07-12: qty 296

## 2024-07-12 NOTE — Progress Notes (Signed)
 [facility] Progress Note     Subjective: CC:  Reports increased pain today, which she attributes to moving a lot/bathing yesterday. States yesterday the sacral pain bothered her the most, this has improved with donut pillow. But today she feels her lumbar spine is causing her more pain. Denies pain shooting down her leg. Reports some R toes numbness when she gets up. Voiding. +flatus. No Bm yet. She plans to stay at her mothers farm in [place].   Objective: Vital signs in last 24 hours: Temp:  [97.8 F (36.6 C)-98.1 F (36.7 C)] 98.1 F (36.7 C) (11/18 0928) Pulse Rate:  [73-98] 98 (11/18 0928) Resp:  [18] 18 (11/18 0928) BP: (106-130)/(74-89) 130/87 (11/18 0928) SpO2:  [95 %-99 %] 95 % (11/18 0928) Last BM Date : 07/09/24  Intake/Output from previous day: 11/17 0701 - 11/18 0700 In: 360 [P.O.:360] Out: -  Intake/Output this shift: No intake/output data recorded.  PE: Gen:  Alert, NAD, pleasant, sitting up, NAD Card:  Regular rate and rhythm, no lower extremity edema, pedal pulse 2 + BL Pulm:  Normal effort ORA Abd: TLSO in place Skin: warm and dry, no rashes  Psych: A&Ox3   Lab Results:  Recent Labs    07/09/24 1634 07/09/24 1641  WBC 11.1*  --   HGB 13.7 13.6  HCT 39.6 40.0  PLT 310  --    BMET Recent Labs    07/09/24 1634 07/09/24 1641  NA 138 136  K 4.2 4.5  CL 108 108  CO2 18*  --   GLUCOSE 96 95  BUN 11 13  CREATININE 0.85 0.80  CALCIUM 9.1  --    PT/INR Recent Labs    07/09/24 1634  LABPROT 13.6  INR 1.0   CMP     Component Value Date/Time   NA 136 07/09/2024 1641   K 4.5 07/09/2024 1641   CL 108 07/09/2024 1641   CO2 18 (L) 07/09/2024 1634   GLUCOSE 95 07/09/2024 1641   BUN 13 07/09/2024 1641   CREATININE 0.80 07/09/2024 1641   CALCIUM 9.1 07/09/2024 1634   PROT 6.9 07/09/2024 1634   ALBUMIN 4.0 07/09/2024 1634   AST 33 07/09/2024 1634   ALT 22 07/09/2024 1634   ALKPHOS 59 07/09/2024 1634   BILITOT 0.8  07/09/2024 1634   GFRNONAA >60 07/09/2024 1634   Lipase  No results found for: LIPASE     Studies/Results: DG THORACOLUMBAR SPINE Result Date: 07/10/2024 EXAM: 2 VIEW(S) XRAY OF THE THORACOLUMBAR SPINE 07/10/2024 02:06:00 PM COMPARISON: CT 07/09/2024. CLINICAL HISTORY: 374582 Closed L1 vertebral fracture (HCC) 374582 Closed L1 vertebral fracture (HCC) FINDINGS: THORACIC SPINE: BONES: Upper thoracic spine excluded. Mild compression fracture deformity anteriorly at T12 with less than 10% loss of height. Alignment is preserved. DISCS AND DEGENERATIVE CHANGES: No severe degenerative changes. SOFT TISSUES: The visualized lungs appear clear. LUMBAR SPINE: BONES: Mild compression fracture deformity anteriorly at L1 with no significant loss of height. Alignment is preserved. DISCS AND DEGENERATIVE CHANGES: No severe degenerative changes. SOFT TISSUES: No acute abnormality. IMPRESSION: 1. Mild anterior compression fractures at T12 and L1, with less than 10% height loss at T12 and no significant height loss at L1. Alignment preserved. Electronically signed by: Dayne Hassell MD 07/10/2024 05:44 PM EST RP Workstation: [id number]    Anti-infectives: Anti-infectives (From admission, onward)    None        Assessment/Plan  43 y/o F s/p fall from horse  T12/L1 fractures - No ligamentous injuries on MRI.  Upright films ok. TLSO. Outpatient follow up with Dr. Joshua in 2 weeks. May remove brace to shower.  Incidental subcentimeter lesion in caudate lobe of liver - plan for outpatient interval liver MRI to further characterize.   Pain control: tylenol  1,000 mg QID, increase robaxin to 1,000 mg QID, add lidoderm  patch, PRN oxy; add advil  and increase oxy today FEN: reg, senna-S BID, miralax BID VTE: SCD's, Lovenox  Dispo: adjust pain meds as above, re-visit DME needs with TOC, possible PM Discharge home with her mother   LOS: 2 days   I reviewed nursing notes, last 24 h vitals and pain scores, last  48 h intake and output, last 24 h labs and trends, and last 24 h imaging results.  This care required moderate level of medical decision making.   Mary Pringle, PA-C [facility] Please see Amion for pager number during day hours 7:00am-4:30pm

## 2024-07-12 NOTE — Progress Notes (Signed)
 Occupational Therapy Treatment Patient Details Name: Mary Frye MRN: [id number] DOB: 1981/04/24 Today's Date: 07/12/2024   History of present illness 43 year old female admitted s/p thrown from a horse earlier today.  Imaging suggested a fracture at T12 and L1 and minimally displaced sacral fracture.  Neuro ordered TLSO brace.  PMH significant for previous concussion, o/w unremarkable.   OT comments  Pt making good progress with functional goals. Pt in chair upon arrival with a friend present. 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 Session focused on commode transfers using DME with proper body mechanics and reviewed use of LH bath sponge and reacher for LB bathing and dressing, pt is able to verbalize and demo understanding. OT will continue to follow acutely to maximize level of function and safety      If plan is discharge home, recommend the following:  A little help with walking and/or transfers;Assistance with cooking/housework;Assist for transportation;Help with stairs or ramp for entrance;A little help with bathing/dressing/bathroom   Equipment Recommendations  BSC/3in1    Recommendations for Other Services      Precautions / Restrictions Precautions Precautions: Back Precaution Booklet Issued: No Recall of Precautions/Restrictions: Intact Required Braces or Orthoses: Spinal Brace Spinal Brace: Thoracolumbosacral orthotic Restrictions Weight Bearing Restrictions Per Provider Order: No       Mobility Bed Mobility               General bed mobility comments: sitting up in recliner on arrival    Transfers Overall transfer level: Modified independent Equipment used: Rolling walker (2 wheels) Transfers: Sit to/from Stand Sit to Stand: Modified independent (Device/Increase time)                 Balance Overall balance assessment: No apparent balance deficits  (not formally assessed)                                         ADL either performed or assessed with clinical judgement   ADL Overall ADL's : Needs assistance/impaired                         Toilet Transfer: Supervision/safety;Modified Independent;Ambulation;Rolling walker (2 wheels);Regular Toilet;BSC/3in1   Toileting- Clothing Manipulation and Hygiene: Modified independent;Sit to/from stand       Functional mobility during ADLs: Modified independent;Rolling walker (2 wheels) General ADL Comments: reviewed use of LH bath sponge and reacher for LB dressing, pt is able to verbalize and demo understanding    Extremity/Trunk Assessment Upper Extremity Assessment Upper Extremity Assessment: Overall WFL for tasks assessed   Lower Extremity Assessment Lower Extremity Assessment: Defer to PT evaluation   Cervical / Trunk Assessment Cervical / Trunk Assessment: Other exceptions Cervical / Trunk Exceptions: new back and sacral fxs    Vision Ability to See in Adequate Light: 0 Adequate Patient Visual Report: No change from baseline     Perception     Praxis     Communication Communication Communication: No apparent difficulties   Cognition Arousal: Alert Behavior During Therapy: WFL for tasks assessed/performed Cognition: No apparent impairments             OT - Cognition Comments: pt is a PTA by profession  Following commands: Intact        Cueing   Cueing Techniques: Verbal cues  Exercises      Shoulder Instructions       General Comments      Pertinent Vitals/ Pain       Pain Assessment Pain Assessment: Faces Pain Score: 5  Pain Location: sacrum and back (sacrum greater than back) Pain Descriptors / Indicators: Aching, Sore Pain Intervention(s): Limited activity within patient's tolerance, Monitored during session, Premedicated before session, Repositioned  Home Living                                           Prior Functioning/Environment              Frequency  Min 2X/week        Progress Toward Goals  OT Goals(current goals can now be found in the care plan section)  Progress towards OT goals: Progressing toward goals     Plan      Co-evaluation                 AM-PAC OT 6 Clicks Daily Activity     Outcome Measure   Help from another person eating meals?: None Help from another person taking care of personal grooming?: A Little Help from another person toileting, which includes using toliet, bedpan, or urinal?: A Little Help from another person bathing (including washing, rinsing, drying)?: A Little Help from another person to put on and taking off regular upper body clothing?: A Little Help from another person to put on and taking off regular lower body clothing?: A Little 6 Click Score: 19    End of Session Equipment Utilized During Treatment: Rolling walker (2 wheels);Back brace;Other (comment) (3 in 1 over toilet)  OT Visit Diagnosis: Unsteadiness on feet (R26.81);Pain Pain - Right/Left: Right Pain - part of body:  (back and sacral area)   Activity Tolerance Patient tolerated treatment well   Patient Left in chair;with call bell/phone within reach   Nurse Communication Mobility status        Time: 8963-8890 OT Time Calculation (min): 33 min  Charges: OT General Charges $OT Visit: 1 Visit OT Treatments $Self Care/Home Management : 8-22 mins $Therapeutic Activity: 8-22 mins    Jacques Karna Loose 07/12/2024, 12:10 PM

## 2024-07-13 ENCOUNTER — Other Ambulatory Visit (HOSPITAL_COMMUNITY): Payer: Self-pay

## 2024-07-13 MED ORDER — IBUPROFEN 600 MG PO TABS
600.0000 mg | ORAL_TABLET | Freq: Three times a day (TID) | ORAL | 0 refills | Status: AC
  Filled 2024-07-13: qty 30, 10d supply, fill #0

## 2024-07-13 MED ORDER — POLYETHYLENE GLYCOL 3350 17 GM/SCOOP PO POWD
17.0000 g | Freq: Two times a day (BID) | ORAL | 0 refills | Status: AC
  Filled 2024-07-13: qty 238, 7d supply, fill #0

## 2024-07-13 MED ORDER — ACETAMINOPHEN 500 MG PO TABS
1000.0000 mg | ORAL_TABLET | Freq: Four times a day (QID) | ORAL | 0 refills | Status: AC
  Filled 2024-07-13: qty 30, 4d supply, fill #0

## 2024-07-13 MED ORDER — METHOCARBAMOL 500 MG PO TABS
1000.0000 mg | ORAL_TABLET | Freq: Four times a day (QID) | ORAL | 0 refills | Status: AC
  Filled 2024-07-13: qty 112, 14d supply, fill #0

## 2024-07-13 MED ORDER — SMOG ENEMA
960.0000 mL | Freq: Once | RECTAL | Status: DC
  Filled 2024-07-13: qty 960

## 2024-07-13 MED ORDER — GABAPENTIN 300 MG PO CAPS
300.0000 mg | ORAL_CAPSULE | Freq: Three times a day (TID) | ORAL | 0 refills | Status: AC
  Filled 2024-07-13: qty 63, 21d supply, fill #0

## 2024-07-13 MED ORDER — LIDOCAINE 5 % EX PTCH
2.0000 | MEDICATED_PATCH | CUTANEOUS | 0 refills | Status: AC
  Filled 2024-07-13: qty 30, 15d supply, fill #0

## 2024-07-13 MED ORDER — OXYCODONE HCL 10 MG PO TABS
15.0000 mg | ORAL_TABLET | ORAL | 0 refills | Status: AC | PRN
  Filled 2024-07-13: qty 30, 7d supply, fill #0

## 2024-07-13 MED ORDER — MAGNESIUM CITRATE PO SOLN
1.0000 | Freq: Once | ORAL | Status: AC
  Administered 2024-07-13: 1 via ORAL
  Filled 2024-07-13: qty 296

## 2024-07-13 MED ORDER — SENNOSIDES-DOCUSATE SODIUM 8.6-50 MG PO TABS: 1.0000 | ORAL_TABLET | Freq: Two times a day (BID) | ORAL | Status: AC

## 2024-07-13 MED ORDER — BISACODYL 10 MG RE SUPP
10.0000 mg | Freq: Once | RECTAL | Status: AC
  Administered 2024-07-13: 10 mg via RECTAL
  Filled 2024-07-13: qty 1

## 2024-07-13 NOTE — Progress Notes (Signed)
 Physical Therapy Treatment Patient Details Name: Mary Frye MRN: [id number] DOB: 23-Dec-1980 Today's Date: 07/13/2024   History of Present Illness 43 year old female admitted s/p thrown from a horse earlier today.  Imaging suggested a fracture at T12 and L1 and minimally displaced sacral fracture.  Neuro ordered TLSO brace.  PMH significant for previous concussion, o/w unremarkable.    PT Comments  The pt is making great functional progress and reporting less pain with transfers/transitions. She was able to enter/exit either side of the bed (bed near if not flat with rails down to simulate home) while following her spinal precautions, but did express more pain entering/exiting the R side of the bed compared to the L due to more pain around her R lower back/hip from the impact. She was able to donn her brace in sitting independently as well. The pt is capable of ambulating without an AD, but does report pain relief when using a RW to ambulate at this time. Thus, recommending pt continue to use RW as needed until pain improves and then safely progress away from relying on it. She verbalized understanding. Practiced tub transfer entering to her R and exiting to her L with bil hands on wall, supervision for safety. Educated pt on car transfers. She verbalized understanding. Will continue to follow acutely.      If plan is discharge home, recommend the following: A little help with walking and/or transfers;A little help with bathing/dressing/bathroom;Assistance with cooking/housework;Help with stairs or ramp for entrance;Assist for transportation   Can travel by private vehicle        Equipment Recommendations  Rolling walker (2 wheels)    Recommendations for Other Services       Precautions / Restrictions Precautions Precautions: Back Precaution Booklet Issued: Yes (comment) Recall of Precautions/Restrictions: Intact Required Braces or Orthoses: Spinal Brace Spinal Brace:  Thoracolumbosacral orthotic (does not need sternal attachment per PA 11/19) Restrictions Weight Bearing Restrictions Per Provider Order: No     Mobility  Bed Mobility Overal bed mobility: Needs Assistance Bed Mobility: Rolling, Sidelying to Sit, Sit to Sidelying Rolling: Supervision Sidelying to sit: Supervision     Sit to sidelying: Supervision General bed mobility comments: HOB slightly elevated for first sidelying to sit L EOB, but bed flat for remaining sit <> sidelying R EOB. Cued pt to push through feet and keep arms anterior to her to prevent twisting when rolling. More painful exiting R EOB than L. Supervision for safety throughout. No use of rails throughout to simulate home set-up    Transfers Overall transfer level: Modified independent Equipment used: Rolling walker (2 wheels) Transfers: Sit to/from Stand Sit to Stand: Modified independent (Device/Increase time)           General transfer comment: Slow to power up to stand and lower to sit, but no LOB or assistance needed, mod I    Ambulation/Gait Ambulation/Gait assistance: Supervision, Modified independent (Device/Increase time) Gait Distance (Feet): 340 Feet (x3 bouts of ~340 ft > ~5 ft > ~15 ft) Assistive device: Rolling walker (2 wheels), None Gait Pattern/deviations: Step-through pattern, Decreased stride length, Decreased weight shift to right Gait velocity: decreased Gait velocity interpretation: 1.31 - 2.62 ft/sec, indicative of limited community ambulator   General Gait Details: Pt ambulates slowly and cautiously with decreased R weight shift due to R pain. No overt LOB, even when ambulating a few steps without UE support (used RW majority of time). Supervision for safety but capable of mod I   Stairs  Wheelchair Mobility     Tilt Bed    Modified Rankin (Stroke Patients Only)       Balance Overall balance assessment: No apparent balance deficits (not formally assessed)                                           Communication Communication Communication: No apparent difficulties  Cognition Arousal: Alert Behavior During Therapy: WFL for tasks assessed/performed   PT - Cognitive impairments: No apparent impairments                         Following commands: Intact      Cueing Cueing Techniques: Verbal cues  Exercises      General Comments General comments (skin integrity, edema, etc.): Practiced tub transfer entering to her R and exiting to her L with bil hands on wall, supervision for safety. Educated pt on car transfers. She verbalized understanding.      Pertinent Vitals/Pain Pain Assessment Pain Assessment: 0-10 Pain Score: 4  Pain Location: sacrum and back (sacrum greater than back) Pain Descriptors / Indicators: Aching, Sore, Sharp Pain Intervention(s): Limited activity within patient's tolerance, Monitored during session, Repositioned    Home Living                          Prior Function            PT Goals (current goals can now be found in the care plan section) Acute Rehab PT Goals Patient Stated Goal: be able to move without pain PT Goal Formulation: With patient Time For Goal Achievement: 07/17/24 Potential to Achieve Goals: Good Progress towards PT goals: Progressing toward goals    Frequency    Min 5X/week      PT Plan      Co-evaluation              AM-PAC PT 6 Clicks Mobility   Outcome Measure  Help needed turning from your back to your side while in a flat bed without using bedrails?: A Little Help needed moving from lying on your back to sitting on the side of a flat bed without using bedrails?: A Little Help needed moving to and from a bed to a chair (including a wheelchair)?: None Help needed standing up from a chair using your arms (e.g., wheelchair or bedside chair)?: None Help needed to walk in hospital room?: None Help needed climbing 3-5 steps with a  railing? : A Little 6 Click Score: 21    End of Session Equipment Utilized During Treatment: Back brace Activity Tolerance: Patient tolerated treatment well Patient left: with call bell/phone within reach;Other (comment) (sitting on commode in bathroom)   PT Visit Diagnosis: Unsteadiness on feet (R26.81);Other abnormalities of gait and mobility (R26.89);Pain Pain - Right/Left:  (back) Pain - part of body:  (back)     Time: 0829-0920 PT Time Calculation (min) (ACUTE ONLY): 51 min  Charges:    $Gait Training: 8-22 mins $Therapeutic Activity: 23-37 mins PT General Charges $$ ACUTE PT VISIT: 1 Visit                     Theo Ferretti, PT, DPT Acute Rehabilitation Services  Office: [phone]    Theo CHRISTELLA Ferretti 07/13/2024, 9:29 AM

## 2024-07-13 NOTE — Progress Notes (Signed)
 Occupational Therapy Treatment Patient Details Name: Mary Frye MRN: [id number] DOB: Dec 20, 1980 Today's Date: 07/13/2024   History of present illness 43 year old female admitted s/p thrown from a horse earlier today.  Imaging suggested a fracture at T12 and L1 and minimally displaced sacral fracture.  Neuro ordered TLSO brace.  PMH significant for previous concussion, o/w unremarkable.   OT comments  Pt is progressing well towards OT established goals. Focus of session on increasing independence with ADL tasks and progressing functional mobility. Pt was able to complete mobility with Mod I and required Min A for ADLs completed during session. Pt continues to benefit from skilled OT services. Continue per POC.       If plan is discharge home, recommend the following:  A little help with walking and/or transfers;Assistance with cooking/housework;Assist for transportation;Help with stairs or ramp for entrance;A little help with bathing/dressing/bathroom   Equipment Recommendations  BSC/3in1 (only if covered by insurance per pt report.)    Recommendations for Other Services      Precautions / Restrictions Precautions Precautions: Back Precaution Booklet Issued: Yes (comment) Recall of Precautions/Restrictions: Intact Required Braces or Orthoses: Spinal Brace Spinal Brace: Thoracolumbosacral orthotic (does not need sternal attachment per PA 11/19) Restrictions Weight Bearing Restrictions Per Provider Order: No       Mobility Bed Mobility               General bed mobility comments: Pt greeted in recliner and returned to recliner. Pt stated that bed mobility is her biggest challenge, hospital bed has been ordered for home    Transfers Overall transfer level: Modified independent Equipment used: Rolling walker (2 wheels) Transfers: Sit to/from Stand Sit to Stand: Modified independent (Device/Increase time)           General transfer comment: Requires increased time  for rise and ambulation to bathroom     Balance Overall balance assessment: No apparent balance deficits (not formally assessed)                                         ADL either performed or assessed with clinical judgement   ADL Overall ADL's : Needs assistance/impaired                 Upper Body Dressing : Minimal assistance;Sitting Upper Body Dressing Details (indicate cue type and reason): assist with brace and sports bra     Toilet Transfer: Modified Independent;Ambulation;Comfort height toilet;Rolling walker (2 wheels) Toilet Transfer Details (indicate cue type and reason): Pt walked to bathroom with RW and used commode without BSC above. Toileting- Clothing Manipulation and Hygiene: Minimal assistance Toileting - Clothing Manipulation Details (indicate cue type and reason): OT provided Min A posterior peri care to complete toileting task to ensure hygeine            Extremity/Trunk Assessment Upper Extremity Assessment Upper Extremity Assessment: Overall WFL for tasks assessed            Vision       Perception     Praxis     Communication Communication Communication: No apparent difficulties   Cognition Arousal: Alert Behavior During Therapy: WFL for tasks assessed/performed Cognition: No apparent impairments             OT - Cognition Comments: pt is a PTA by profession  Following commands: Intact        Cueing   Cueing Techniques: Verbal cues  Exercises      Shoulder Instructions       General Comments TLSO adjusted in sitting. OT addressed Pt concerns regarding return to home within scope, all needs met this session. Pt requesting pain meds, RN notified and broughout pain meds at end of session.    Pertinent Vitals/ Pain       Pain Assessment Pain Assessment: Faces Faces Pain Scale: Hurts even more Pain Location: sacrum and back Pain Descriptors / Indicators: Discomfort, Grimacing,  Guarding Pain Intervention(s): Limited activity within patient's tolerance, Monitored during session, Patient requesting pain meds-RN notified  Home Living                                          Prior Functioning/Environment              Frequency  Min 2X/week        Progress Toward Goals  OT Goals(current goals can now be found in the care plan section)  Progress towards OT goals: Progressing toward goals  Acute Rehab OT Goals Patient Stated Goal: to get home OT Goal Formulation: With patient Time For Goal Achievement: 07/25/24 Potential to Achieve Goals: Good ADL Goals Pt Will Perform Lower Body Bathing: with modified independence;with adaptive equipment;sit to/from stand Pt Will Perform Lower Body Dressing: with modified independence;with adaptive equipment;sit to/from stand Pt Will Perform Tub/Shower Transfer: Tub transfer;with supervision;ambulating;3 in 1;rolling walker Additional ADL Goal #1: Pt will walk to bathroom and complete all toileting using walker and 3:1 over commode with mod I  Plan      Co-evaluation                 AM-PAC OT 6 Clicks Daily Activity     Outcome Measure   Help from another person eating meals?: None Help from another person taking care of personal grooming?: A Little Help from another person toileting, which includes using toliet, bedpan, or urinal?: A Little Help from another person bathing (including washing, rinsing, drying)?: A Little Help from another person to put on and taking off regular upper body clothing?: A Little Help from another person to put on and taking off regular lower body clothing?: A Little 6 Click Score: 19    End of Session Equipment Utilized During Treatment: Rolling walker (2 wheels);Back brace  OT Visit Diagnosis: Unsteadiness on feet (R26.81);Pain Pain - part of body:  (sacrum and back)   Activity Tolerance Patient tolerated treatment well   Patient Left in chair;with  call bell/phone within reach;with nursing/sitter in room;with family/visitor present   Nurse Communication Patient requests pain meds        Time: 8652-8586 OT Time Calculation (min): 26 min  Charges: OT General Charges $OT Visit: 1 Visit OT Treatments $Self Care/Home Management : 23-37 mins  Mary CROME, OTR/L.  [facility] Acute Rehabilitation  Office: [phone]   Mary Frye 07/13/2024, 2:21 PM

## 2024-07-13 NOTE — Plan of Care (Signed)
 No BM for shift.  Patient has been up and walking and on the recliner.  Problem: Education: Goal: Knowledge of General Education information will improve Description: Including pain rating scale, medication(s)/side effects and non-pharmacologic comfort measures Outcome: Progressing   Problem: Health Behavior/Discharge Planning: Goal: Ability to manage health-related needs will improve Outcome: Progressing   Problem: Clinical Measurements: Goal: Ability to maintain clinical measurements within normal limits will improve Outcome: Progressing Goal: Will remain free from infection Outcome: Progressing Goal: Diagnostic test results will improve Outcome: Progressing Goal: Respiratory complications will improve Outcome: Progressing Goal: Cardiovascular complication will be avoided Outcome: Progressing

## 2024-07-13 NOTE — TOC Transition Note (Signed)
 Transition of Care [facility]) - Discharge Note   Patient Details  Name: [name] MRN: [id number] Date of Birth: 09/23/1980  Transition of Care [facility]) CM/SW Contact:  Shakiyah Cirilo M, RN Phone Number: 07/13/2024, 12:04 PM   Clinical Narrative:    Patient medically stable for discharge home today with family to provide needed assistance and HH follow up as previously arranged.  Patient requests hospital bed for home; order and hospital bed narrative completed by provider. Spoke with patient; she is aware that bed may not be delivered until later this evening or tomorrow.  Delivery address is [address] [place], [place] [address].   Final next level of care: Home w Home Health Services Barriers to Discharge: Barriers Resolved   Patient Goals and CMS Choice Patient states their goals for this hospitalization and ongoing recovery are:: less pain CMS Medicare.gov Compare Post Acute Care list provided to:: Patient Choice offered to / list presented to : Patient                            Discharge Plan and Services Additional resources added to the After Visit Summary for     Discharge Planning Services: CM Consult Post Acute Care Choice: Home Health          DME Arranged: Bedside commode, Walker rolling DME Agency: [facility] Date DME Agency Contacted: 07/12/24 Time DME Agency Contacted: 1445 Representative spoke with at DME Agency: Ryan Breed HH Arranged: PT, OT HH Agency: [facility] Date [facility] Agency Contacted: 07/11/24 Time HH Agency Contacted: 1730 Representative spoke with at [facility] Agency: Burnard Bucks  Social Drivers of Health (SDOH) Interventions SDOH Screenings   Food Insecurity: No Food Insecurity (07/10/2024)  Housing: Low Risk  (07/10/2024)  Transportation Needs: No Transportation Needs (07/10/2024)  Utilities: Not At Risk (07/10/2024)  Tobacco Use: Low Risk  (07/09/2024)     Readmission Risk Interventions     No data to display          Mliss MICAEL Fass, RN, BSN  Trauma/Neuro ICU Case Manager [phone]

## 2024-07-13 NOTE — TOC Initial Note (Signed)
 Transition of Care [facility]) - Initial/Assessment Note    Patient Details  Name: Mary Frye MRN: [id number] Date of Birth: 05-13-1981  Transition of Care [facility]) CM/SW Contact:    Icis Budreau M, RN Phone Number: 07/13/2024, 11:54 AM  Clinical Narrative:                 43 year old female admitted s/p thrown from a horse earlier on 07/09/2024. Imaging suggested a fracture at T12 and L1 and minimally displaced sacral fracture. Neuro ordered TLSO brace.  PTA, pt independent and living in a hotel temporarily with her husband and kids while her house is being remodeled. She plans to dc to her mother's home when discharged; mom able to provide neeeded assistance.  DC address is [address] [place], [place] [address]. PT/OT recommending HH follow up, and patient agreeable to services; referral to [facility], as patient used to work there. Referral to [facility] for RW and [facility], to be delivered to bedside prior to dc.    Expected Discharge Plan: Home w Home Health Services Barriers to Discharge: Barriers Resolved   Patient Goals and CMS Choice Patient states their goals for this hospitalization and ongoing recovery are:: less pain CMS Medicare.gov Compare Post Acute Care list provided to:: Patient Choice offered to / list presented to : Patient      Expected Discharge Plan and Services   Discharge Planning Services: CM Consult Post Acute Care Choice: Home Health Living arrangements for the past 2 months: Single Family Home Expected Discharge Date: 07/13/24               DME Arranged: Bedside commode, Walker rolling DME Agency: [facility] Date DME Agency Contacted: 07/12/24 Time DME Agency Contacted: 1445 Representative spoke with at DME Agency: Ryan Breed HH Arranged: PT, OT HH Agency: [facility] Date [facility] Agency Contacted: 07/11/24 Time HH Agency Contacted: 1730 Representative spoke with at [facility] Agency: Burnard Bucks  Prior Living  Arrangements/Services Living arrangements for the past 2 months: Single Family Home Lives with:: Spouse, Minor Children Patient language and need for interpreter reviewed:: Yes Do you feel safe going back to the place where you live?: Yes      Need for Family Participation in Patient Care: Yes (Comment) Care giver support system in place?: Yes (comment)   Criminal Activity/Legal Involvement Pertinent to Current Situation/Hospitalization: No - Comment as needed  Activities of Daily Living      Permission Sought/Granted   Permission granted to share information with : Yes, Verbal Permission Granted     Permission granted to share info w AGENCY: HH agency, DME agency        Emotional Assessment Appearance:: Appears stated age Attitude/Demeanor/Rapport: Engaged Affect (typically observed): Accepting Orientation: : Oriented to Self, Oriented to Place, Oriented to  Time, Oriented to Situation      Admission diagnosis:  Thoracic spine fracture (HCC) [S22.009A] Fall, initial encounter [W19.XXXA] Closed fracture of first lumbar vertebra, unspecified fracture morphology, initial encounter (HCC) [S32.019A] Closed fracture of sacrum, unspecified portion of sacrum, initial encounter (HCC) [S32.10XA] Closed fracture of twelfth thoracic vertebra, unspecified fracture morphology, initial encounter [facility]) [S22.089A] Patient Active Problem List   Diagnosis Date Noted   Sacral fracture, closed (HCC) 07/10/2024   Thoracic spine fracture (HCC) 07/09/2024   S/P abdominoplasty 08/26/2020   Previous cesarean section 02/16/2020   Cesarean delivery delivered 02/16/2020   S/P cesarean section 12/18/2015   Post-dates pregnancy 12/17/2015   PCP:  Alvera Reagin, PA Pharmacy:  [id number] - [place], [place] - [address] AT [facility] OF [address] [place] [address] Phone: [phone] Fax: [phone]  [id number] - [place], [place] -  [address] AT CORNER OF [facility] [address] [place] [address] Phone: [phone] Fax: [phone]  [facility] [address] [place] [address] Phone: [phone] Fax: [phone]     Social Drivers of Health (SDOH) Social History: SDOH Screenings   Food Insecurity: No Food Insecurity (07/10/2024)  Housing: Low Risk  (07/10/2024)  Transportation Needs: No Transportation Needs (07/10/2024)  Utilities: Not At Risk (07/10/2024)  Tobacco Use: Low Risk  (07/09/2024)   SDOH Interventions:  NA   Readmission Risk Interventions     No data to display         Mliss MICAEL Fass, RN, BSN  Trauma/Neuro ICU Case Manager [phone]

## 2024-07-13 NOTE — Discharge Summary (Signed)
 [facility] Discharge Summary   Patient ID: Mary Frye MRN: [id number] DOB/AGE: 09/15/1980 43 y.o.  Admit date: 07/09/2024 Discharge date: 07/13/2024  Admitting Diagnosis: Fall from horse Spinal fracture  Discharge Diagnosis Patient Active Problem List   Diagnosis Date Noted   Sacral fracture, closed (HCC) 07/10/2024   Thoracic spine fracture (HCC) 07/09/2024   S/P abdominoplasty 08/26/2020   Previous cesarean section 02/16/2020   Cesarean delivery delivered 02/16/2020   S/P cesarean section 12/18/2015   Post-dates pregnancy 12/17/2015    Consultants Neurosurgery   Imaging: No results found.  Procedures None   HPI:  Mary Frye is a 43 yo female who presented to the ED after falling from a horse. She felt severe pain in her back immediately after the fall. She was wearing a helmet and denies loss of consciousness. She is able to move all extremities with no numbness or weakness. She has been hemodynamically stable. Imaging workup showed an S5 fracture, and T12/L1 vertebral body fractures. An MRI did not show ligamentous injuries. Neurosurgery has evaluated the patient and recommends a TLSO brace. The patient has continued to have significant back pain. Trauma was consulted for admission.   She is overall in good health. She does not take any blood thinners.  Hospital Course:  Trauma workup significant for the below injuries along with their management:  43 y/o F s/p fall from horse T12/L1 fractures - No ligamentous injuries on MRI. Upright films ok. TLSO. Outpatient follow up with Dr. Joshua in 2 weeks. May remove brace to shower.  Incidental subcentimeter lesion in caudate lobe of liver - plan for outpatient interval liver MRI to further characterize.  Pain control: tylenol  1,000 mg QID, advil  600 mg TID, robaxin  1,000 mg QID, lidoderm  patch, PRN oxycodone   On 07/13/24 the patient was stable for discharge home with the support of her family and outpatient  neurosurgical and PCP follow up.    I have personally reviewed the patients medication history on the [place] controlled substance database.  Physical Exam: Gen:  Alert, NAD, pleasant, sitting up, NAD Card:  Regular rate and rhythm, no lower extremity edema, pedal pulse 2 + BL Pulm:  Normal effort ORA Abd: TLSO in place Skin: warm and dry, no rashes  Psych: A&Ox3   Allergies as of 07/13/2024       Reactions   Sulfa Antibiotics Hives, Itching, Rash   Celebrex  [celecoxib ] Hives        Medication List     STOP taking these medications    cyclobenzaprine 5 MG tablet Commonly known as: FLEXERIL       TAKE these medications    acetaminophen  500 MG tablet Commonly known as: TYLENOL  Take 2 tablets (1,000 mg total) by mouth every 6 (six) hours.   amphetamine -dextroamphetamine  20 MG tablet Commonly known as: ADDERALL Take 20 mg by mouth daily as needed (loss of concentration).   Azelaic Acid 15 % gel Apply 1 Application topically 2 (two) times daily.   fluticasone  50 MCG/ACT nasal spray Commonly known as: FLONASE  Place 2 sprays into both nostrils daily.   gabapentin  300 MG capsule Commonly known as: NEURONTIN  Take 1 capsule (300 mg total) by mouth 3 (three) times daily for 21 days.   ibuprofen  600 MG tablet Commonly known as: ADVIL  Take 1 tablet (600 mg total) by mouth 3 (three) times daily. What changed:  medication strength how much to take when to take this reasons to take this   Junel FE 1.5/30 1.5-30 MG-MCG tablet  Generic drug: norethindrone-ethinyl estradiol-iron Take 1 tablet by mouth daily.   lidocaine  5 % Commonly known as: LIDODERM  Place 2 patches onto the skin daily. Remove & Discard patch within 12 hours or as directed by MD   loratadine  10 MG tablet Commonly known as: CLARITIN  Take 1 tablet (10 mg total) by mouth daily. What changed:  when to take this reasons to take this   methocarbamol  500 MG tablet Commonly known as: ROBAXIN  Take 2  tablets (1,000 mg total) by mouth 4 (four) times daily for 14 days.   Methylphenidate HCl ER 54 MG Tb24 TAKE 1 TABLET BY MOUTH EVERY DAY IN THE MORNING AS NEEDED FOR FOCUS   Multivitamin Gummies Womens Chew Chew 1-2 each by mouth in the morning.   Oxycodone  HCl 10 MG Tabs Take 1.5 tablets (15 mg total) by mouth every 4 (four) hours as needed for up to 5 days for severe pain (pain score 7-10) (not controlled with tylenol , advil , robaxin ).   polyethylene glycol powder 17 GM/SCOOP powder Commonly known as: GLYCOLAX /MIRALAX  Dissolve 1 capful (17g) in 4-8 ounces of liquid and take by mouth 2 (two) times daily.   senna-docusate 8.6-50 MG tablet Commonly known as: Senokot-S Take 1 tablet by mouth 2 (two) times daily.   venlafaxine  XR 75 MG 24 hr capsule Commonly known as: EFFEXOR -XR Take 75 mg by mouth daily.               Durable Medical Equipment  (From admission, onward)           Start     Ordered   07/13/24 0759  For home use only DME Hospital bed  Once       Question Answer Comment  Length of Need 6 Months   Bed type Semi-electric      07/13/24 0758   07/12/24 1520  For home use only DME Bedside commode  Once       Question:  Patient needs a bedside commode to treat with the following condition  Answer:  Thoracic spine fracture (HCC)   07/12/24 1520   07/12/24 1520  For home use only DME Walker rolling  Once       Question Answer Comment  Walker: With 5 Inch Wheels   Patient needs a walker to treat with the following condition Thoracic spine fracture (HCC)      07/12/24 1520              Contact information for follow-up providers     Joshua, Alm Hamilton, MD. Schedule an appointment as soon as possible for a visit in 2 week(s).   Specialty: Neurosurgery Contact information: [address] [place] [address] [phone]         Alvera Reagin, [place]. Schedule an appointment as soon as possible for a visit in 2 week(s).    Specialty: Physician Assistant Why: for post-hospital follow up, discuss/order MRI of the liver; incidental finding of subcentimeter lesion of the liver Contact information: [address] [place] [address] [phone]              Contact information for after-discharge care     Home Medical Care     [facility] .   Service: Home Health Services Contact information: [address] [place]  [address] [phone]                     Signed:  Almarie Pringle, [facility] 07/13/2024, 3:30 PM

## 2024-07-13 NOTE — Progress Notes (Signed)
 Patient suffers from multiple spinal fractures. A hospital bed is recommended for the patient to be able to maneuver in and out of bed safely without further injury and to minimize the risk of falling/recurrent injury. The hospital bed will be able to accommodate the patient with the need for frequent positioning safely and the flexibility on height to  be able to transfer safely from the bed to the floor with her walker.    Mary Pringle, PA-C [facility] Please see Amion for pager number during day hours 7:00am-4:30pm
